# Patient Record
Sex: Male | Born: 1950
Health system: Southern US, Community
[De-identification: ages and names within clinical notes are randomized; demographics above are authoritative.]

## PROBLEM LIST (undated history)

## (undated) DIAGNOSIS — K219 Gastro-esophageal reflux disease without esophagitis: Secondary | ICD-10-CM

## (undated) DIAGNOSIS — Z87442 Personal history of urinary calculi: Secondary | ICD-10-CM

## (undated) DIAGNOSIS — C4491 Basal cell carcinoma of skin, unspecified: Secondary | ICD-10-CM

## (undated) DIAGNOSIS — F32A Depression, unspecified: Secondary | ICD-10-CM

## (undated) DIAGNOSIS — E785 Hyperlipidemia, unspecified: Secondary | ICD-10-CM

## (undated) DIAGNOSIS — F329 Major depressive disorder, single episode, unspecified: Secondary | ICD-10-CM

## (undated) DIAGNOSIS — D229 Melanocytic nevi, unspecified: Secondary | ICD-10-CM

## (undated) DIAGNOSIS — Z8619 Personal history of other infectious and parasitic diseases: Secondary | ICD-10-CM

## (undated) DIAGNOSIS — K439 Ventral hernia without obstruction or gangrene: Secondary | ICD-10-CM

## (undated) DIAGNOSIS — T7840XA Allergy, unspecified, initial encounter: Secondary | ICD-10-CM

## (undated) DIAGNOSIS — K5790 Diverticulosis of intestine, part unspecified, without perforation or abscess without bleeding: Secondary | ICD-10-CM

## (undated) DIAGNOSIS — K922 Gastrointestinal hemorrhage, unspecified: Secondary | ICD-10-CM

## (undated) HISTORY — DX: Basal cell carcinoma of skin, unspecified: C44.91

## (undated) HISTORY — PX: TONSILLECTOMY: SUR1361

## (undated) HISTORY — DX: Hyperlipidemia, unspecified: E78.5

## (undated) HISTORY — DX: Ventral hernia without obstruction or gangrene: K43.9

## (undated) HISTORY — DX: Gastrointestinal hemorrhage, unspecified: K92.2

## (undated) HISTORY — DX: Depression, unspecified: F32.A

## (undated) HISTORY — DX: Allergy, unspecified, initial encounter: T78.40XA

## (undated) HISTORY — DX: Major depressive disorder, single episode, unspecified: F32.9

## (undated) HISTORY — DX: Personal history of other infectious and parasitic diseases: Z86.19

## (undated) HISTORY — DX: Personal history of urinary calculi: Z87.442

## (undated) HISTORY — DX: Diverticulosis of intestine, part unspecified, without perforation or abscess without bleeding: K57.90

## (undated) HISTORY — PX: KIDNEY STONE SURGERY: SHX686

## (undated) HISTORY — DX: Gastro-esophageal reflux disease without esophagitis: K21.9

---
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
## 1989-04-23 DIAGNOSIS — Z87442 Personal history of urinary calculi: Secondary | ICD-10-CM

## 1989-04-23 HISTORY — DX: Personal history of urinary calculi: Z87.442

## 2001-07-24 DIAGNOSIS — K219 Gastro-esophageal reflux disease without esophagitis: Secondary | ICD-10-CM

## 2001-07-24 HISTORY — DX: Gastro-esophageal reflux disease without esophagitis: K21.9

## 2002-02-23 DIAGNOSIS — Z8619 Personal history of other infectious and parasitic diseases: Secondary | ICD-10-CM

## 2002-02-23 HISTORY — DX: Personal history of other infectious and parasitic diseases: Z86.19

## 2002-12-25 DIAGNOSIS — E785 Hyperlipidemia, unspecified: Secondary | ICD-10-CM

## 2002-12-25 HISTORY — DX: Hyperlipidemia, unspecified: E78.5

## 2004-09-23 DIAGNOSIS — K922 Gastrointestinal hemorrhage, unspecified: Secondary | ICD-10-CM

## 2004-09-23 HISTORY — DX: Gastrointestinal hemorrhage, unspecified: K92.2

## 2004-10-24 DIAGNOSIS — K5790 Diverticulosis of intestine, part unspecified, without perforation or abscess without bleeding: Secondary | ICD-10-CM

## 2004-10-24 HISTORY — DX: Diverticulosis of intestine, part unspecified, without perforation or abscess without bleeding: K57.90

## 2007-04-06 ENCOUNTER — Emergency Department (HOSPITAL_COMMUNITY): Admission: EM | Admit: 2007-04-06 | Discharge: 2007-04-06 | Payer: Self-pay | Admitting: Emergency Medicine

## 2008-05-30 ENCOUNTER — Encounter: Admission: RE | Admit: 2008-05-30 | Discharge: 2008-05-30 | Payer: Self-pay | Admitting: Gastroenterology

## 2008-06-21 ENCOUNTER — Encounter: Admission: RE | Admit: 2008-06-21 | Discharge: 2008-06-21 | Payer: Self-pay | Admitting: Family Medicine

## 2008-11-23 DIAGNOSIS — C4491 Basal cell carcinoma of skin, unspecified: Secondary | ICD-10-CM

## 2008-11-23 HISTORY — DX: Basal cell carcinoma of skin, unspecified: C44.91

## 2008-11-28 DIAGNOSIS — D229 Melanocytic nevi, unspecified: Secondary | ICD-10-CM

## 2008-11-28 HISTORY — DX: Melanocytic nevi, unspecified: D22.9

## 2009-12-19 ENCOUNTER — Ambulatory Visit (HOSPITAL_COMMUNITY): Admission: RE | Admit: 2009-12-19 | Discharge: 2009-12-19 | Payer: Self-pay | Admitting: Gastroenterology

## 2010-02-05 ENCOUNTER — Encounter (INDEPENDENT_AMBULATORY_CARE_PROVIDER_SITE_OTHER): Payer: Self-pay | Admitting: Surgery

## 2010-02-05 ENCOUNTER — Inpatient Hospital Stay (HOSPITAL_COMMUNITY)
Admission: RE | Admit: 2010-02-05 | Discharge: 2010-02-09 | Payer: Self-pay | Source: Home / Self Care | Attending: Surgery | Admitting: Surgery

## 2010-02-05 HISTORY — PX: COLECTOMY: SHX59

## 2010-03-16 NOTE — Discharge Summary (Signed)
  Chad Juarez, STANN NO.:  [id number]  MEDICAL RECORD NO.:  [id number]          PATIENT TYPE:  INP  LOCATION:  5126                         FACILITY:  [facility]  PHYSICIAN:  Currie Paris, M.D.DATE OF BIRTH:  1950/05/07  DATE OF ADMISSION:  02/05/2010 DATE OF DISCHARGE:  02/09/2010                              DISCHARGE SUMMARY   FINAL DIAGNOSIS:  Intestinal duplication cyst of sigmoid colon.  CLINICAL HISTORY:  Mr. Nanda is a 60 year old gentleman who was recently found to have a what looked like a large sigmoid diverticulum that at times would become markedly dilated to many centimeters and caused him discomfort.  He elected to proceed to sigmoid colectomy.  HOSPITAL COURSE:  The patient was admitted and taken to the operating room where a sigmoid colectomy was performed.  The operative finding was that of a very large either diverticulum or duplication of the mid- sigmoid.  We simply did a resection of that area of the colon as rest of the colon grossly looked normal, although he had a few scattered tics. I did not think it was appropriate to do a full large sigmoid resection given his symptoms from this large diverticulum.  The patient tolerated the procedure well.  He gradually regained bowel function and was able to started liquids and advanced to solids and by the fourth postoperative day, felt he was able to be discharged.  He was discharged in satisfactory condition to resume usual home meds and given some pain pills.  He is to follow up in my office.  Pathology report indicated that this showed a duplication cyst which was lined while benign colorectal mucosa.     Currie Paris, M.D.     CJS/MEDQ  D:  03/13/2010  T:  03/14/2010  Job:  [id number]  cc:   Griffith Citron, M.D.  Electronically Signed by Cyndia Bent M.D. on 03/16/2010 07:38:24 AM

## 2010-05-06 LAB — DIFFERENTIAL
Basophils Absolute: 0 10*3/uL (ref 0.0–0.1)
Basophils Relative: 0 % (ref 0–1)
Eosinophils Absolute: 0 10*3/uL (ref 0.0–0.7)
Eosinophils Relative: 0 % (ref 0–5)
Lymphocytes Relative: 15 % (ref 12–46)
Lymphs Abs: 1.6 10*3/uL (ref 0.7–4.0)
Monocytes Absolute: 0.7 10*3/uL (ref 0.1–1.0)
Monocytes Relative: 7 % (ref 3–12)
Neutro Abs: 7.9 10*3/uL — ABNORMAL HIGH (ref 1.7–7.7)
Neutrophils Relative %: 77 % (ref 43–77)

## 2010-05-06 LAB — URINALYSIS, ROUTINE W REFLEX MICROSCOPIC
Bilirubin Urine: NEGATIVE
Glucose, UA: NEGATIVE mg/dL
Hgb urine dipstick: NEGATIVE
Ketones, ur: NEGATIVE mg/dL
Nitrite: NEGATIVE
Protein, ur: NEGATIVE mg/dL
Specific Gravity, Urine: 1.002 — ABNORMAL LOW (ref 1.005–1.030)
Urobilinogen, UA: 0.2 mg/dL (ref 0.0–1.0)
pH: 6.5 (ref 5.0–8.0)

## 2010-05-06 LAB — COMPREHENSIVE METABOLIC PANEL
ALT: 12 U/L (ref 0–53)
AST: 19 U/L (ref 0–37)
Albumin: 4.4 g/dL (ref 3.5–5.2)
Alkaline Phosphatase: 68 U/L (ref 39–117)
BUN: 8 mg/dL (ref 6–23)
CO2: 27 mEq/L (ref 19–32)
Calcium: 9.9 mg/dL (ref 8.4–10.5)
Chloride: 105 mEq/L (ref 96–112)
Creatinine, Ser: 0.88 mg/dL (ref 0.4–1.5)
GFR calc Af Amer: >60 mL/min (ref >60)
GFR calc non Af Amer: >60 mL/min (ref >60)
Glucose, Bld: 116 mg/dL — ABNORMAL HIGH (ref 70–99)
Potassium: 4.7 mEq/L (ref 3.5–5.1)
Sodium: 140 mEq/L (ref 135–145)
Total Bilirubin: 0.6 mg/dL (ref 0.3–1.2)
Total Protein: 6.9 g/dL (ref 6.0–8.3)

## 2010-05-06 LAB — PROTIME-INR
INR: 0.94 (ref 0.00–1.49)
Prothrombin Time: 12.8 seconds (ref 11.6–15.2)

## 2010-05-06 LAB — CBC
HCT: 43.4 % (ref 39.0–52.0)
Hemoglobin: 13.8 g/dL (ref 13.0–17.0)
MCH: 27.4 pg (ref 26.0–34.0)
MCHC: 31.8 g/dL (ref 30.0–36.0)
MCV: 86.1 fL (ref 78.0–100.0)
Platelets: 249 10*3/uL (ref 150–400)
RBC: 5.04 MIL/uL (ref 4.22–5.81)
RDW: 12.9 % (ref 11.5–15.5)
WBC: 10.3 10*3/uL (ref 4.0–10.5)

## 2010-05-06 LAB — APTT: aPTT: 27 seconds (ref 24–37)

## 2010-05-06 LAB — SURGICAL PCR SCREEN
MRSA, PCR: NEGATIVE
Staphylococcus aureus: NEGATIVE

## 2010-11-14 LAB — DIFFERENTIAL
Basophils Absolute: 0
Basophils Relative: 0
Eosinophils Absolute: 0
Eosinophils Relative: 0
Lymphocytes Relative: 16
Lymphs Abs: 1.3
Monocytes Absolute: 0.5
Monocytes Relative: 6
Neutro Abs: 6.2
Neutrophils Relative %: 77

## 2010-11-14 LAB — POCT I-STAT CREATININE
Creatinine, Ser: 1
Operator id: 146091

## 2010-11-14 LAB — I-STAT 8, (EC8 V) (CONVERTED LAB)
BUN: 9
Bicarbonate: 28.9 — ABNORMAL HIGH
Chloride: 104
Glucose, Bld: 95
HCT: 44
Hemoglobin: 15
Operator id: 146091
Potassium: 3.8
Sodium: 139
TCO2: 31
pCO2, Ven: 66.8 — ABNORMAL HIGH
pH, Ven: 7.244 — ABNORMAL LOW

## 2010-11-14 LAB — CBC
HCT: 40.7
Hemoglobin: 13.5
MCHC: 33
MCV: 83
Platelets: 264
RBC: 4.91
RDW: 13.5
WBC: 8

## 2010-11-14 LAB — POCT CARDIAC MARKERS
CKMB, poc: <1 — ABNORMAL LOW
CKMB, poc: <1 — ABNORMAL LOW
Myoglobin, poc: 31.2
Myoglobin, poc: 47.7
Operator id: 146091
Operator id: 146091
Troponin i, poc: <0.05
Troponin i, poc: <0.05

## 2011-08-14 ENCOUNTER — Ambulatory Visit (INDEPENDENT_AMBULATORY_CARE_PROVIDER_SITE_OTHER): Payer: BC Managed Care – PPO | Admitting: Family Medicine

## 2011-08-14 VITALS — BP 151/78 | HR 64 | Temp 98.8°F | Resp 16 | Ht 69.25 in | Wt 148.8 lb

## 2011-08-14 DIAGNOSIS — K219 Gastro-esophageal reflux disease without esophagitis: Secondary | ICD-10-CM

## 2011-08-14 DIAGNOSIS — K439 Ventral hernia without obstruction or gangrene: Secondary | ICD-10-CM

## 2011-08-14 LAB — POCT CBC
Granulocyte percent: 77.5 %G (ref 37–80)
HCT, POC: 45.8 % (ref 43.5–53.7)
Hemoglobin: 15.1 g/dL (ref 14.1–18.1)
Lymph, poc: 1.6 (ref 0.6–3.4)
MCH, POC: 29 pg (ref 27–31.2)
MCHC: 33 g/dL (ref 31.8–35.4)
MCV: 88 fL (ref 80–97)
MID (cbc): 0.3 (ref 0–0.9)
MPV: 8.7 fL (ref 0–99.8)
POC Granulocyte: 6.4 (ref 2–6.9)
POC LYMPH PERCENT: 19 %L (ref 10–50)
POC MID %: 3.5 %M (ref 0–12)
Platelet Count, POC: 269 10*3/uL (ref 142–424)
RBC: 5.21 M/uL (ref 4.69–6.13)
RDW, POC: 13.5 %
WBC: 8.3 10*3/uL (ref 4.6–10.2)

## 2011-08-14 LAB — COMPREHENSIVE METABOLIC PANEL
ALT: 19 U/L (ref 0–53)
AST: 19 U/L (ref 0–37)
Albumin: 5.2 g/dL (ref 3.5–5.2)
Alkaline Phosphatase: 68 U/L (ref 39–117)
BUN: 12 mg/dL (ref 6–23)
CO2: 29 mEq/L (ref 19–32)
Calcium: 10.3 mg/dL (ref 8.4–10.5)
Chloride: 103 mEq/L (ref 96–112)
Creat: 0.81 mg/dL (ref 0.50–1.35)
Glucose, Bld: 109 mg/dL — ABNORMAL HIGH (ref 70–99)
Potassium: 5 mEq/L (ref 3.5–5.3)
Sodium: 141 mEq/L (ref 135–145)
Total Bilirubin: 0.7 mg/dL (ref 0.3–1.2)
Total Protein: 7.2 g/dL (ref 6.0–8.3)

## 2011-08-14 MED ORDER — SUCRALFATE 1 G PO TABS: 1.0000 g | ORAL_TABLET | Freq: Four times a day (QID) | ORAL | Status: DC

## 2011-08-14 NOTE — Progress Notes (Signed)
Patient Name: Chad Juarez Date of Birth: 09/19/1950 Medical Record Number: [id number] Gender: male Date of Encounter: 08/14/2011  History of Present Illness:  Chad Juarez is a 61 y.o. very pleasant male patient who presents with the following:  He has noted trouble with GERD for a couple of months.  He ate a very spicy meal in March and developed symptoms, started on prilosec and felt better after a week or so.  He then had return of symptoms in April- started back on prilosec, then changed to prevacid and even tried increasing his dose to 30 BID- it has helped some, but has not resolved his symptoms.  He notes a burning feeling in his esophagus, burping, and more discomfort after spicy or acidic foods and in the afternoon.   He had a partial colonic resection about 18 months ago for giant diverticula/ duplication cyst.  He thinks that he may have a ventral hernia which he has noted for a few months.  It is not painful, but he can see it when he does a sit up.  No SOB, no cough, no weight loss, some nausea, no vomiting.  No bowel habit changes- he is back to normal since his operation.    He has had severe reflux in the past, and it has taken months for it to resolve in the past.  He had a very severe bout in 2004 at which time he was noted to be positive for H. Pylori. He was treated with antibiotics and got better.    He is a retired Teacher, early years/pre.   There is no problem list on file for this patient.  No past medical history on file. No past surgical history on file. History  Substance Use Topics  . Smoking status: Former Smoker    Quit date: 08/13/2001  . Smokeless tobacco: Not on file  . Alcohol Use: Not on file   No family history on file. No Known Allergies  Medication list has been reviewed and updated.  Prior to Admission medications   Medication Sig Start Date End Date Taking? Authorizing Provider  aspirin 81 MG tablet Take 81 mg by mouth daily.   Yes Historical  Provider, MD  lansoprazole (PREVACID) 15 MG capsule Take 30 mg by mouth daily.   Yes Historical Provider, MD  Multiple Vitamin (MULTIVITAMIN) tablet Take 1 tablet by mouth daily.   Yes Historical Provider, MD  rosuvastatin (CRESTOR) 5 MG tablet Take 5 mg by mouth daily.   Yes Historical Provider, MD  vitamin E 400 UNIT capsule Take 400 Units by mouth daily.   Yes Historical Provider, MD    Review of Systems:  As per HPI- otherwise negative.   Physical Examination: Filed Vitals:   08/14/11 1002  BP: 151/78  Pulse: 64  Temp: 98.8 F (37.1 C)  Resp: 16   Filed Vitals:   08/14/11 1002  Height: 5' 9.25" (1.759 m)  Weight: 148 lb 12.8 oz (67.495 kg)   Body mass index is 21.82 kg/(m^2). Ideal Body Weight: Weight in (lb) to have BMI = 25: 170.2   GEN: WDWN, NAD, Non-toxic, A & O x 3 HEENT: Atraumatic, Normocephalic. Neck supple. No masses, No LAD.  Oropharynx wnl, PEERL Ears and Nose: No external deformity. CV: RRR, No M/G/R. No JVD. No thrill. No extra heart sounds. PULM: CTA B, no wheezes, crackles, rhonchi. No retractions. No resp. distress. No accessory muscle use. ABD: S, ND, +BS. No rebound. No HSM.  There is a  small ventral hernia in the upper part of the abdominal wall visible when he does a sit- up.  Minimal tenderness over epigastric area.   EXTR: No c/c/e NEURO Normal gait.  PSYCH: Normally interactive. Conversant. Not depressed or anxious appearing.  Calm demeanor.   Results for orders placed in visit on 08/14/11  POCT CBC      Component Value Range   WBC 8.3  4.6 - 10.2 K/uL   Lymph, poc 1.6  0.6 - 3.4   POC LYMPH PERCENT 19.0  10 - 50 %L   MID (cbc) 0.3  0 - 0.9   POC MID % 3.5  0 - 12 %M   POC Granulocyte 6.4  2 - 6.9   Granulocyte percent 77.5  37 - 80 %G   RBC 5.21  4.69 - 6.13 M/uL   Hemoglobin 15.1  14.1 - 18.1 g/dL   HCT, POC 96.2  95.2 - 53.7 %   MCV 88.0  80 - 97 fL   MCH, POC 29.0  27 - 31.2 pg   MCHC 33.0  31.8 - 35.4 g/dL   RDW, POC 84.1      Platelet Count, POC 269  142 - 424 K/uL   MPV 8.7  0 - 99.8 fL   Assessment and Plan: 1. GERD (gastroesophageal reflux disease)  HELICOBACTER PYLORI  ANTIBODY, IGM, sucralfate (CARAFATE) 1 G tablet, POCT CBC, Comprehensive metabolic panel  2. Ventral hernia     Suspect GERD, ?H. Pylori.  Await H pylori test, add carafate. Continue prilosec. If he does not get better with treatment of H pylori (if positive) or if negative plan likely referral to GI. Will plan further follow- up pending labs. Let us know sooner if worse.  Reassured that ventral hernia likely does not need repair unless it becomes more bothersome.  He does not note any discomfort from it at this time.  He will let me know if symptoms acutely change or get worse.    Abbe Amsterdam, MD

## 2011-08-17 LAB — HELICOBACTER PYLORI  ANTIBODY, IGM: Helicobacter pylori, IgM: 1.7 U/mL (ref <9.0)

## 2011-08-19 ENCOUNTER — Encounter: Payer: Self-pay | Admitting: Gastroenterology

## 2011-08-19 NOTE — Addendum Note (Signed)
Addended by: Abbe Amsterdam C on: 08/19/2011 08:40 AM   Modules accepted: Orders

## 2011-08-25 ENCOUNTER — Encounter: Payer: Self-pay | Admitting: Gastroenterology

## 2011-08-25 ENCOUNTER — Ambulatory Visit (INDEPENDENT_AMBULATORY_CARE_PROVIDER_SITE_OTHER): Payer: Self-pay | Admitting: Gastroenterology

## 2011-08-25 VITALS — BP 110/68 | HR 92 | Ht 70.0 in | Wt 149.1 lb

## 2011-08-25 DIAGNOSIS — K219 Gastro-esophageal reflux disease without esophagitis: Secondary | ICD-10-CM

## 2011-08-25 DIAGNOSIS — Z9049 Acquired absence of other specified parts of digestive tract: Secondary | ICD-10-CM

## 2011-08-25 DIAGNOSIS — Z9889 Other specified postprocedural states: Secondary | ICD-10-CM

## 2011-08-25 MED ORDER — DEXLANSOPRAZOLE 60 MG PO CPDR: 60.0000 mg | DELAYED_RELEASE_CAPSULE | Freq: Every day | ORAL | Status: DC

## 2011-08-25 NOTE — Progress Notes (Signed)
History of Present Illness:  This is a [name] with chronic GERD for many years followed by Dr. Kinnie Juarez. He currently is on Prevacid 30 mg twice a day because of severe recent flares of his burning substernal chest pain and regurgitation. He denies dysphagia or any hepatobiliary complaints, has had previous negative upper nominal ultrasound. He also denies lower gastrointestinal issues since he had partial sigmoid resection because of a large congenital sigmoid colon diverticulum. His surgery was performed by Dr. Cyndia Juarez. He has not had previous endoscopic exams. The patient otherwise is in good health without complaints. He does not smoke, abuse alcohol or NSAIDs. Numerous records and radiographs reviewed at length today. Also colonoscopy from Dr. Kinnie Juarez requested for review.  I have reviewed this patient's present history, medical and surgical past history, allergies and medications.     ROS: The remainder of the 10 point ROS is negative... no history of Raynaud's phenomenon.     Physical Exam: Blood pressure 110 or 68, pulse 92 and regular, weight 149 pounds. General well developed well nourished patient in no acute distress, appearing their stated age Eyes PERRLA, no icterus, fundoscopic exam per opthamologist Skin no lesions noted Neck supple, no adenopathy, no thyroid enlargement, no tenderness Chest clear to percussion and auscultation Heart no significant murmurs, gallops or rubs noted Abdomen no hepatosplenomegaly masses or tenderness, BS normal. Ventral hernia demonstrated with straight leg testing. Extremities no acute joint lesions, edema, phlebitis or evidence of cellulitis. Neurologic patient oriented x 3, cranial nerves intact, no focal neurologic deficits noted. Psychological mental status normal and normal affect.  Assessment and plan: Chronic GERD and probable prolapsing hiatal hernia. Endoscopic exam is been scheduled to exclude Barrett's mucosa,  rule out H. pylori, and to assess his esophageal healing on twice a day PPI therapy. I discussed an anti-reflux regime with him and the need for chronic PPI therapy. Also patient education video on GERD shown to patient. He is up-to-date on his colonoscopy exams. I have changed him to Dexilant 60 mg every morning as tolerated. He'll continue primary care followup as scheduled. Please copy this note to Dr. Ellamae Juarez.  Encounter Diagnosis  Name Primary?  . GERD (gastroesophageal reflux disease) Yes

## 2011-08-25 NOTE — Patient Instructions (Addendum)
Upper GI Endoscopy Upper GI endoscopy means using a flexible scope to look at the esophagus, stomach, and upper small bowel. This is done to make a diagnosis in people with heartburn, abdominal pain, or abnormal bleeding. Sometimes an endoscope is needed to remove foreign bodies or food that become stuck in the esophagus; it can also be used to take biopsy samples. For the best results, do not eat or drink for 8 hours before having your upper endoscopy.  To perform the endoscopy, you will probably be sedated and your throat will be numbed with a special spray. The endoscope is then slowly passed down your throat (this will not interfere with your breathing). An endoscopy exam takes 15 to 30 minutes to complete and there is no real pain. Patients rarely remember much about the procedure. The results of the test may take several days if a biopsy or other test is taken.  You may have a sore throat after an endoscopy exam. Serious complications are very rare. Stick to liquids and soft foods until your pain is better. Do not drive a car or operate any dangerous equipment for at least 24 hours after being sedated. SEEK IMMEDIATE MEDICAL CARE IF:   You have severe throat pain.   You have shortness of breath.   You have bleeding problems.   You have a fever.   You have difficulty recovering from your sedation.  Document Released: 03/19/2004 Document Revised: 01/29/2011 Document Reviewed: 02/12/2008 [facility] Patient Information 2012 [place], [place].  Your Endoscopy is scheduled on 09/04/2011 at 2:30pm on the 4th floor Separate instructions have been given

## 2011-09-04 ENCOUNTER — Ambulatory Visit (AMBULATORY_SURGERY_CENTER): Payer: BC Managed Care – PPO | Admitting: Gastroenterology

## 2011-09-04 ENCOUNTER — Encounter: Payer: Self-pay | Admitting: Gastroenterology

## 2011-09-04 VITALS — BP 140/91 | HR 83 | Temp 95.7°F | Resp 12 | Ht 70.0 in | Wt 149.0 lb

## 2011-09-04 DIAGNOSIS — K219 Gastro-esophageal reflux disease without esophagitis: Secondary | ICD-10-CM

## 2011-09-04 MED ORDER — SODIUM CHLORIDE 0.9 % IV SOLN: 500.0000 mL | INTRAVENOUS | Status: DC

## 2011-09-04 NOTE — Progress Notes (Signed)
Patient did not experience any of the following events: a burn prior to discharge; a fall within the facility; wrong site/side/patient/procedure/implant event; or a hospital transfer or hospital admission upon discharge from the facility. (G8907) Patient did not have preoperative order for IV antibiotic SSI prophylaxis. (G8918)  

## 2011-09-04 NOTE — Op Note (Signed)
[facility] [address] [place], [place]  [address]  ENDOSCOPY PROCEDURE REPORT  PATIENT:  Chad, Juarez  MR#:  [id number] BIRTHDATE:  [birth year], 60 yrs. old  GENDER:  male  ENDOSCOPIST:  Vania Rea. Jarold Motto, MD, [facility] Referred by:  PROCEDURE DATE:  09/04/2011 PROCEDURE:  EGD with biopsy for H. pylori 19147 ASA CLASS:  Class II INDICATIONS:  heartburn R/O BARRETT'S MUCOSA  MEDICATIONS:   propofol (Diprivan) 150 mg IV TOPICAL ANESTHETIC:  Cetacaine Spray  DESCRIPTION OF PROCEDURE:   After the risks and benefits of the procedure were explained, informed consent was obtained.  The [facility] GIF-[id number] [id number] endoscope was introduced through the mouth and advanced to the second portion of the duodenum.  The instrument was slowly withdrawn as the mucosa was fully examined. <<PROCEDUREIMAGES>>  The upper, middle, and distal third of the esophagus were carefully inspected and no abnormalities were noted. The z-line was well seen at the GEJ. The endoscope was pushed into the fundus which was normal including a retroflexed view. The antrum,gastric body, first and second part of the duodenum were unremarkable. CLO BX. DONE.    NO LARGE HH NOTED.  The scope was then withdrawn from the patient and the procedure completed.  COMPLICATIONS:  None  ENDOSCOPIC IMPRESSION: 1) Normal EGD TREATED GERD. RECOMMENDATIONS: 1) Await biopsy results 2) Rx CLO if positive 3) continue PPI  ______________________________ Vania Rea. Jarold Motto, MD, [facility]  CC:  Ellamae Sia, MD  n. Rosalie Doctor:   Vania Rea. Torsha Lemus at 09/04/2011 02:32 PM  Job Founds, [id number]

## 2011-09-04 NOTE — Progress Notes (Signed)
Propofol per crna j.nulty. See scanned intra procedure report. ewm

## 2011-09-04 NOTE — Patient Instructions (Addendum)
Discharge instructions given with verbal understanding. Resume previous medications. YOU HAD AN ENDOSCOPIC PROCEDURE TODAY AT THE [facility]: Refer to the procedure report that was given to you for any specific questions about what was found during the examination.  If the procedure report does not answer your questions, please call your gastroenterologist to clarify.  If you requested that your care partner not be given the details of your procedure findings, then the procedure report has been included in a sealed envelope for you to review at your convenience later.  YOU SHOULD EXPECT: Some feelings of bloating in the abdomen. Passage of more gas than usual.  Walking can help get rid of the air that was put into your GI tract during the procedure and reduce the bloating. If you had a lower endoscopy (such as a colonoscopy or flexible sigmoidoscopy) you may notice spotting of blood in your stool or on the toilet paper. If you underwent a bowel prep for your procedure, then you may not have a normal bowel movement for a few days.  DIET: Your first meal following the procedure should be a light meal and then it is ok to progress to your normal diet.  A half-sandwich or bowl of soup is an example of a good first meal.  Heavy or fried foods are harder to digest and may make you feel nauseous or bloated.  Likewise meals heavy in dairy and vegetables can cause extra gas to form and this can also increase the bloating.  Drink plenty of fluids but you should avoid alcoholic beverages for 24 hours.  ACTIVITY: Your care partner should take you home directly after the procedure.  You should plan to take it easy, moving slowly for the rest of the day.  You can resume normal activity the day after the procedure however you should NOT DRIVE or use heavy machinery for 24 hours (because of the sedation medicines used during the test).    SYMPTOMS TO REPORT IMMEDIATELY: A gastroenterologist can be reached  at any hour.  During normal business hours, 8:30 AM to 5:00 PM Monday through Friday, call [phone].  After hours and on weekends, please call the GI answering service at [phone] who will take a message and have the physician on call contact you.   Following upper endoscopy (EGD)  Vomiting of blood or coffee ground material  New chest pain or pain under the shoulder blades  Painful or persistently difficult swallowing  New shortness of breath  Fever of 100F or higher  Black, tarry-looking stools  FOLLOW UP: If any biopsies were taken you will be contacted by phone or by letter within the next 1-3 weeks.  Call your gastroenterologist if you have not heard about the biopsies in 3 weeks.  Our staff will call the home number listed on your records the next business day following your procedure to check on you and address any questions or concerns that you may have at that time regarding the information given to you following your procedure. This is a courtesy call and so if there is no answer at the home number and we have not heard from you through the emergency physician on call, we will assume that you have returned to your regular daily activities without incident.  SIGNATURES/CONFIDENTIALITY: You and/or your care partner have signed paperwork which will be entered into your electronic medical record.  These signatures attest to the fact that that the information above on your After Visit Summary   has been reviewed and is understood.  Full responsibility of the confidentiality of this discharge information lies with you and/or your care-partner. 

## 2011-09-07 ENCOUNTER — Telehealth: Payer: Self-pay | Admitting: *Deleted

## 2011-09-07 NOTE — Telephone Encounter (Signed)
  Follow up Call-  Call back number 09/04/2011  Post procedure Call Back phone  # [phone]  Permission to leave phone message Yes     Patient questions:  Do you have a fever, pain , or abdominal swelling? no Pain Score  0 *  Have you tolerated food without any problems? yes  Have you been able to return to your normal activities? yes  Do you have any questions about your discharge instructions: Diet   no Medications  no Follow up visit  no  Do you have questions or concerns about your Care? no  Actions: * If pain score is 4 or above: No action needed, pain <4.

## 2011-09-28 ENCOUNTER — Telehealth: Payer: Self-pay | Admitting: Gastroenterology

## 2011-09-28 NOTE — Telephone Encounter (Signed)
Notified pt his bx for H. Pylori was negative; pt stated understanding.

## 2011-12-22 ENCOUNTER — Ambulatory Visit (INDEPENDENT_AMBULATORY_CARE_PROVIDER_SITE_OTHER): Payer: BC Managed Care – PPO | Admitting: Family Medicine

## 2011-12-22 DIAGNOSIS — Z23 Encounter for immunization: Secondary | ICD-10-CM

## 2012-06-29 ENCOUNTER — Ambulatory Visit (INDEPENDENT_AMBULATORY_CARE_PROVIDER_SITE_OTHER): Payer: BC Managed Care – PPO | Admitting: Internal Medicine

## 2012-06-29 ENCOUNTER — Encounter: Payer: Self-pay | Admitting: Internal Medicine

## 2012-06-29 VITALS — BP 148/81 | HR 77 | Temp 97.0°F | Resp 17 | Wt 147.0 lb

## 2012-06-29 DIAGNOSIS — Z0000 Encounter for general adult medical examination without abnormal findings: Secondary | ICD-10-CM

## 2012-06-29 DIAGNOSIS — E785 Hyperlipidemia, unspecified: Secondary | ICD-10-CM

## 2012-06-29 LAB — CBC WITH DIFFERENTIAL/PLATELET
Basophils Absolute: 0 10*3/uL (ref 0.0–0.1)
Basophils Relative: 0 % (ref 0–1)
Eosinophils Absolute: 0 10*3/uL (ref 0.0–0.7)
Lymphs Abs: 1.6 10*3/uL (ref 0.7–4.0)
MCH: 29.1 pg (ref 26.0–34.0)
MCHC: 33.8 g/dL (ref 30.0–36.0)
Neutrophils Relative %: 78 % — ABNORMAL HIGH (ref 43–77)
Platelets: 229 10*3/uL (ref 150–400)
RBC: 5.56 MIL/uL (ref 4.22–5.81)
RDW: 12.7 % (ref 11.5–15.5)

## 2012-06-29 LAB — POCT URINALYSIS DIPSTICK
Leukocytes, UA: NEGATIVE
Protein, UA: NEGATIVE
Urobilinogen, UA: 0.2
pH, UA: 5.5

## 2012-06-29 LAB — IFOBT (OCCULT BLOOD): IFOBT: NEGATIVE

## 2012-06-29 MED ORDER — FLUTICASONE PROPIONATE 50 MCG/ACT NA SUSP: 2.0000 | Freq: Every day | NASAL | Status: DC

## 2012-06-29 MED ORDER — ROSUVASTATIN CALCIUM 5 MG PO TABS: 5.0000 mg | ORAL_TABLET | Freq: Every day | ORAL | Status: DC

## 2012-06-29 NOTE — Progress Notes (Signed)
  Subjective:    Patient ID: Chad Juarez, male    DOB: 17-Jul-1950, 62 y.o.   MRN: [id number]  HPIcpe  hyperlipidemia  retir Chad Juarez Doing well x mom alz(a fib,falls,pulm htn)/father insists on caretaking so Chad Juarez becomes 24h 7d/wk medical on call as 2 sibs dont provde help  Colon utd-dr Chad Juarez Recent GERD resolved neph hodgkins stable ? Cured  immun utd-consider zosta  Review of Systems Neg x aller rhin And sleep disturb from above stress   and pain L hip w/ activity req a few days of rest And occas HAs in same place after trauma  Objective:   Physical Exam BP 148/81  Pulse 77  Temp(Src) 97 F (36.1 C) (Oral)  Resp 17  Wt 147 lb (66.679 kg)  BMI 21.09 kg/m2 HEENT-cl x boggy turbs No tmeg Ht reg w/out m,c,r,g Lungs clear abd supple Pros WNL ? L 5 oclock Heme extr clear Neuro intact Psych stable      Assessment & Plan:  Annual CPE Hyperlip Hip Pain All Rhin  To d/c vit D and ASA Hip exercises given/pt next or ortho Advised vacation respite from care of parents  meds after labs

## 2012-06-30 LAB — COMPREHENSIVE METABOLIC PANEL
ALT: 19 U/L (ref 0–53)
AST: 19 U/L (ref 0–37)
Alkaline Phosphatase: 79 U/L (ref 39–117)
Creat: 0.85 mg/dL (ref 0.50–1.35)
Sodium: 136 mEq/L (ref 135–145)
Total Bilirubin: 0.8 mg/dL (ref 0.3–1.2)
Total Protein: 8 g/dL (ref 6.0–8.3)

## 2012-06-30 LAB — LIPID PANEL
LDL Cholesterol: 93 mg/dL (ref 0–99)
Total CHOL/HDL Ratio: 2.7 Ratio
VLDL: 15 mg/dL (ref 0–40)

## 2012-07-04 ENCOUNTER — Encounter: Payer: Self-pay | Admitting: Internal Medicine

## 2012-12-19 ENCOUNTER — Ambulatory Visit (INDEPENDENT_AMBULATORY_CARE_PROVIDER_SITE_OTHER): Payer: BC Managed Care – PPO

## 2012-12-19 DIAGNOSIS — Z23 Encounter for immunization: Secondary | ICD-10-CM

## 2013-07-26 ENCOUNTER — Ambulatory Visit (INDEPENDENT_AMBULATORY_CARE_PROVIDER_SITE_OTHER): Payer: BC Managed Care – PPO | Admitting: Internal Medicine

## 2013-07-26 ENCOUNTER — Encounter: Payer: Self-pay | Admitting: Internal Medicine

## 2013-07-26 VITALS — BP 126/60 | HR 81 | Temp 98.4°F | Resp 16 | Ht 69.5 in | Wt 143.8 lb

## 2013-07-26 DIAGNOSIS — Z125 Encounter for screening for malignant neoplasm of prostate: Secondary | ICD-10-CM

## 2013-07-26 DIAGNOSIS — Z0000 Encounter for general adult medical examination without abnormal findings: Secondary | ICD-10-CM

## 2013-07-26 DIAGNOSIS — E785 Hyperlipidemia, unspecified: Secondary | ICD-10-CM

## 2013-07-26 DIAGNOSIS — Z1211 Encounter for screening for malignant neoplasm of colon: Secondary | ICD-10-CM

## 2013-07-26 LAB — CBC WITH DIFFERENTIAL/PLATELET
BASOS ABS: 0 10*3/uL (ref 0.0–0.1)
Basophils Relative: 0 % (ref 0–1)
EOS PCT: 0 % (ref 0–5)
Eosinophils Absolute: 0 10*3/uL (ref 0.0–0.7)
HEMATOCRIT: 45.2 % (ref 39.0–52.0)
Hemoglobin: 15.3 g/dL (ref 13.0–17.0)
Lymphocytes Relative: 19 % (ref 12–46)
Lymphs Abs: 1.6 10*3/uL (ref 0.7–4.0)
MCH: 29.4 pg (ref 26.0–34.0)
MCHC: 33.8 g/dL (ref 30.0–36.0)
MCV: 86.8 fL (ref 78.0–100.0)
MONO ABS: 0.5 10*3/uL (ref 0.1–1.0)
MONOS PCT: 6 % (ref 3–12)
NEUTROS ABS: 6.2 10*3/uL (ref 1.7–7.7)
Neutrophils Relative %: 75 % (ref 43–77)
Platelets: 215 10*3/uL (ref 150–400)
RBC: 5.21 MIL/uL (ref 4.22–5.81)
RDW: 13.2 % (ref 11.5–15.5)
WBC: 8.2 10*3/uL (ref 4.0–10.5)

## 2013-07-26 LAB — POCT URINALYSIS DIPSTICK
BILIRUBIN UA: NEGATIVE
Blood, UA: NEGATIVE
Glucose, UA: NEGATIVE
KETONES UA: NEGATIVE
Leukocytes, UA: NEGATIVE
Nitrite, UA: NEGATIVE
Protein, UA: NEGATIVE
Urobilinogen, UA: 0.2
pH, UA: 6

## 2013-07-26 LAB — IFOBT (OCCULT BLOOD): IMMUNOLOGICAL FECAL OCCULT BLOOD TEST: NEGATIVE

## 2013-07-26 MED ORDER — FLUTICASONE PROPIONATE 50 MCG/ACT NA SUSP: 2.0000 | Freq: Every day | NASAL | Status: DC

## 2013-07-26 MED ORDER — ROSUVASTATIN CALCIUM 5 MG PO TABS: 5.0000 mg | ORAL_TABLET | Freq: Every day | ORAL | Status: DC

## 2013-07-26 NOTE — Progress Notes (Signed)
Subjective:    Patient ID: Chad Juarez, male    DOB: June 13, 1950, 63 y.o.   MRN: [id number]  HPIannual  only medical problem is hyperlipidemia Continues to spend a lot of time caring for elderly parents, particularly his mother now almost wheelchair-bound Retired Software engineer  Paper chart reveals immunizations up-to-date in colonoscopy not due for the second screening for another 5 years  Past history, family history and social history are unchanged   Review of Systems  Constitutional: Negative.   HENT: Negative.   Eyes: Negative.   Respiratory: Negative.   Cardiovascular: Negative.   Gastrointestinal: Negative.   Endocrine: Negative.   Genitourinary: Negative.   Musculoskeletal: Negative.   Skin: Negative.   Allergic/Immunologic: Positive for environmental allergies.  Neurological: Negative.   Hematological: Negative.   Psychiatric/Behavioral: Positive for sleep disturbance.   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 He appears well-developed and well-nourished.  HENT:  Head: Normocephalic.  Right Ear: External ear normal.  Left Ear: External ear normal.  Nose: Nose normal.  Mouth/Throat: Oropharynx is clear and moist.  Tms and canals clear  Eyes: Conjunctivae and EOM are normal. Pupils are equal, round, and reactive to light.  Neck: Normal range of motion. Neck supple. No thyromegaly present.  Cardiovascular: Normal rate, regular rhythm, normal heart sounds and intact distal pulses.   No murmur heard. Pulmonary/Chest: Effort normal and breath sounds normal. No respiratory distress. He has no wheezes. He has no rales.  Abdominal: Soft. Bowel sounds are normal. He exhibits no distension and no mass. There is no tenderness. There is no rebound and no guarding.  No hepatosplenomegaly  Genitourinary:  Rectal exam shows no masses the prostate is soft and  symmetrical without nodules//Hemosure obtained  Musculoskeletal: Normal range of motion. He exhibits no edema and no tenderness.  Lymphadenopathy:    He has no cervical adenopathy.  Neurological: He is alert and oriented to person, place, and time. He has normal reflexes. No cranial nerve deficit. He exhibits normal muscle tone. Coordination normal.  Skin: Skin is warm and dry. No rash noted.  Psychiatric: He has a normal mood and affect. His behavior is normal. Judgment and thought content normal.   BP 126/60  Pulse 81  Temp(Src) 98.4 F (36.9 C) (Oral)  Resp 16  Ht 5' 9.5" (1.765 m)  Wt 143 lb 12.8 oz (65.227 kg)  BMI 20.94 kg/m2  SpO2 98%        Assessment & Plan:  Annual exam Problem #1 hyperlipidemia  Results for orders placed in visit on 07/26/13  CBC WITH DIFFERENTIAL      Result Value Ref Range   WBC 8.2  4.0 - 10.5 K/uL   RBC 5.21  4.22 - 5.81 MIL/uL   Hemoglobin 15.3  13.0 - 17.0 g/dL   HCT 45.2  39.0 - 52.0 %   MCV 86.8  78.0 - 100.0 fL   MCH 29.4  26.0 - 34.0 pg   MCHC 33.8  30.0 - 36.0 g/dL   RDW 13.2  11.5 - 15.5 %   Platelets 215  150 - 400 K/uL   Neutrophils Relative % 75  43 - 77 %   Neutro Abs 6.2  1.7 - 7.7 K/uL   Lymphocytes Relative 19  12 - 46 %   Lymphs Abs 1.6  0.7 - 4.0 K/uL   Monocytes Relative  6  3 - 12 %   Monocytes Absolute 0.5  0.1 - 1.0 K/uL   Eosinophils Relative 0  0 - 5 %   Eosinophils Absolute 0.0  0.0 - 0.7 K/uL   Basophils Relative 0  0 - 1 %   Basophils Absolute 0.0  0.0 - 0.1 K/uL   Smear Review Criteria for review not met    COMPREHENSIVE METABOLIC PANEL      Result Value Ref Range   Sodium 137  135 - 145 mEq/L   Potassium 4.0  3.5 - 5.3 mEq/L   Chloride 97  96 - 112 mEq/L   CO2 29  19 - 32 mEq/L   Glucose, Bld 92  70 - 99 mg/dL   BUN 10  6 - 23 mg/dL   Creat 0.81  0.50 - 1.35 mg/dL   Total Bilirubin 0.8  0.2 - 1.2 mg/dL   Alkaline Phosphatase 70  39 - 117 U/L   AST 21  0 - 37 U/L   ALT 20  0 - 53 U/L   Total  Protein 7.2  6.0 - 8.3 g/dL   Albumin 5.0  3.5 - 5.2 g/dL   Calcium 10.0  8.4 - 10.5 mg/dL  LIPID PANEL      Result Value Ref Range   Cholesterol 157  0 - 200 mg/dL   Triglycerides 82  <150 mg/dL   HDL 60  >39 mg/dL   Total CHOL/HDL Ratio 2.6     VLDL 16  0 - 40 mg/dL   LDL Cholesterol 81  0 - 99 mg/dL  PSA      Result Value Ref Range   PSA 2.13  <=4.00 ng/mL  POCT URINALYSIS DIPSTICK      Result Value Ref Range   Color, UA yellow     Clarity, UA clear     Glucose, UA neg     Bilirubin, UA neg     Ketones, UA neg     Spec Grav, UA <=1.005     Blood, UA neg     pH, UA 6.0     Protein, UA neg     Urobilinogen, UA 0.2     Nitrite, UA neg     Leukocytes, UA Negative    IFOBT (OCCULT BLOOD)      Result Value Ref Range   IFOBT Negative     Meds ordered this encounter  Medications  . OVER THE COUNTER MEDICATION    Sig: Famotidine (Pepcid) 20 mg prn  . fluticasone (FLONASE) 50 MCG/ACT nasal spray    Sig: Place 2 sprays into both nostrils daily.    Dispense:  16 g    Refill:  6  . rosuvastatin (CRESTOR) 5 MG tablet    Sig: Take 1 tablet (5 mg total) by mouth daily.    Dispense:  90 tablet    Refill:  3

## 2013-07-27 LAB — COMPREHENSIVE METABOLIC PANEL
ALBUMIN: 5 g/dL (ref 3.5–5.2)
ALT: 20 U/L (ref 0–53)
AST: 21 U/L (ref 0–37)
Alkaline Phosphatase: 70 U/L (ref 39–117)
BUN: 10 mg/dL (ref 6–23)
CO2: 29 meq/L (ref 19–32)
Calcium: 10 mg/dL (ref 8.4–10.5)
Chloride: 97 mEq/L (ref 96–112)
Creat: 0.81 mg/dL (ref 0.50–1.35)
Glucose, Bld: 92 mg/dL (ref 70–99)
POTASSIUM: 4 meq/L (ref 3.5–5.3)
Sodium: 137 mEq/L (ref 135–145)
Total Bilirubin: 0.8 mg/dL (ref 0.2–1.2)
Total Protein: 7.2 g/dL (ref 6.0–8.3)

## 2013-07-27 LAB — LIPID PANEL
CHOLESTEROL: 157 mg/dL (ref 0–200)
HDL: 60 mg/dL (ref >39)
LDL Cholesterol: 81 mg/dL (ref 0–99)
Total CHOL/HDL Ratio: 2.6 Ratio
Triglycerides: 82 mg/dL (ref <150)
VLDL: 16 mg/dL (ref 0–40)

## 2013-07-27 LAB — PSA: PSA: 2.13 ng/mL (ref <=4.00)

## 2013-08-04 ENCOUNTER — Encounter: Payer: Self-pay | Admitting: Internal Medicine

## 2013-08-10 ENCOUNTER — Encounter: Payer: Self-pay | Admitting: Family Medicine

## 2013-12-21 ENCOUNTER — Ambulatory Visit (INDEPENDENT_AMBULATORY_CARE_PROVIDER_SITE_OTHER): Payer: BC Managed Care – PPO

## 2013-12-21 DIAGNOSIS — Z23 Encounter for immunization: Secondary | ICD-10-CM

## 2014-05-23 ENCOUNTER — Other Ambulatory Visit: Payer: Self-pay | Admitting: Dermatology

## 2014-05-23 DIAGNOSIS — C4491 Basal cell carcinoma of skin, unspecified: Secondary | ICD-10-CM

## 2014-05-23 HISTORY — DX: Basal cell carcinoma of skin, unspecified: C44.91

## 2014-08-01 ENCOUNTER — Ambulatory Visit (INDEPENDENT_AMBULATORY_CARE_PROVIDER_SITE_OTHER): Payer: BC Managed Care – PPO | Admitting: Internal Medicine

## 2014-08-01 ENCOUNTER — Encounter: Payer: Self-pay | Admitting: Internal Medicine

## 2014-08-01 ENCOUNTER — Other Ambulatory Visit: Payer: Self-pay | Admitting: Internal Medicine

## 2014-08-01 VITALS — BP 125/68 | HR 70 | Temp 98.4°F | Resp 16 | Ht 69.5 in | Wt 142.0 lb

## 2014-08-01 DIAGNOSIS — Z0000 Encounter for general adult medical examination without abnormal findings: Secondary | ICD-10-CM

## 2014-08-01 DIAGNOSIS — E785 Hyperlipidemia, unspecified: Secondary | ICD-10-CM | POA: Diagnosis not present

## 2014-08-01 LAB — POCT URINALYSIS DIPSTICK
BILIRUBIN UA: NEGATIVE
Blood, UA: NEGATIVE
Glucose, UA: NEGATIVE
Ketones, UA: NEGATIVE
LEUKOCYTES UA: NEGATIVE
Nitrite, UA: NEGATIVE
Protein, UA: NEGATIVE
Urobilinogen, UA: 0.2
pH, UA: 5.5

## 2014-08-01 MED ORDER — FLUTICASONE PROPIONATE 50 MCG/ACT NA SUSP: 2.0000 | Freq: Every day | NASAL | Status: DC

## 2014-08-01 MED ORDER — ROSUVASTATIN CALCIUM 5 MG PO TABS: 5.0000 mg | ORAL_TABLET | Freq: Every day | ORAL | Status: DC

## 2014-08-01 NOTE — Progress Notes (Signed)
Subjective:    Patient ID: Chad Juarez, male    DOB: Jul 14, 1950, 64 y.o.   MRN: [id number]  HPI Annual exam Patient Active Problem List   Diagnosis Date Noted  . Other and unspecified hyperlipidemia 06/29/2012    -  [place] Doing well in general Still extremely busy postretirement caring for his parents//his siblings offer little help//he also has 2 aging dogs that may be near their end  Health maintenance issues up-to-date  Review of Systems  Constitutional: Negative.   HENT: Negative.   Eyes: Negative.   Respiratory: Negative.   Cardiovascular: Negative.   Gastrointestinal: Negative.   Endocrine: Negative.   Genitourinary: Negative.   Musculoskeletal: Positive for arthralgias.  Skin: Negative.   Allergic/Immunologic: Positive for environmental allergies.  Neurological: Negative.   Hematological: Negative.   Psychiatric/Behavioral: Positive for sleep disturbance.   sleep mainly affected by racing mind/no daytime hypersomnolence     Objective:   Physical Exam  Constitutional: He is oriented to person, place, and time. He appears well-developed and well-nourished.  HENT:  Head: Normocephalic and atraumatic.  Right Ear: Hearing, tympanic membrane, external ear and ear canal normal.  Left Ear: Hearing, tympanic membrane, external ear and ear canal normal.  Nose: Nose normal.  Mouth/Throat: Uvula is midline, oropharynx is clear and moist and mucous membranes are normal.  Eyes: Conjunctivae, EOM and lids are normal. Pupils are equal, round, and reactive to light. Right eye exhibits no discharge. Left eye exhibits no discharge. No scleral icterus.  Neck: Trachea normal and normal range of motion. Neck supple. Carotid bruit is not present.  Cardiovascular: Normal rate, regular rhythm, normal heart sounds, intact distal pulses and normal pulses.   No murmur heard. Pulmonary/Chest: Effort normal and breath sounds normal. No respiratory distress. He has no wheezes. He has no  rhonchi. He has no rales.  Abdominal: Soft. Normal appearance and bowel sounds are normal. He exhibits no abdominal bruit. There is no tenderness.  Musculoskeletal: Normal range of motion. He exhibits no edema or tenderness.  Lymphadenopathy:       Head (right side): No submental, no submandibular, no tonsillar, no preauricular, no posterior auricular and no occipital adenopathy present.       Head (left side): No submental, no submandibular, no tonsillar, no preauricular, no posterior auricular and no occipital adenopathy present.    He has no cervical adenopathy.  Neurological: He is alert and oriented to person, place, and time. He has normal strength and normal reflexes. No cranial nerve deficit or sensory deficit. Coordination and gait normal.  Skin: Skin is warm, dry and intact. No lesion and no rash noted.  Psychiatric: He has a normal mood and affect. His speech is normal and behavior is normal. Judgment and thought content normal.  BP 125/68 mmHg  Pulse 70  Temp(Src) 98.4 F (36.9 C) (Oral)  Resp 16  Ht 5' 9.5" (1.765 m)  Wt 142 lb (64.411 kg)  BMI 20.68 kg/m2  SpO2 96%    Assessment & Plan:  Hyperlipidemia - Plan: CBC with Differential/Platelet, Comprehensive metabolic panel, Lipid panel, PSA  Routine general medical examination at a health care facility - Plan: POCT urinalysis dipstick  Meds ordered this encounter  Medications  . rosuvastatin (CRESTOR) 5 MG tablet    Sig: Take 1 tablet (5 mg total) by mouth daily.    Dispense:  90 tablet    Refill:  3  . fluticasone (FLONASE) 50 MCG/ACT nasal spray    Sig: Place 2 sprays into  both nostrils daily.    Dispense:  16 g    Refill:  6

## 2014-08-02 ENCOUNTER — Encounter: Payer: Self-pay | Admitting: Internal Medicine

## 2014-08-02 LAB — CBC WITH DIFFERENTIAL/PLATELET
BASOS PCT: 0 % (ref 0–1)
Basophils Absolute: 0 10*3/uL (ref 0.0–0.1)
Eosinophils Absolute: 0 10*3/uL (ref 0.0–0.7)
Eosinophils Relative: 0 % (ref 0–5)
HCT: 43.5 % (ref 39.0–52.0)
Hemoglobin: 14.4 g/dL (ref 13.0–17.0)
Lymphocytes Relative: 27 % (ref 12–46)
Lymphs Abs: 1.8 10*3/uL (ref 0.7–4.0)
MCH: 29.1 pg (ref 26.0–34.0)
MCHC: 33.1 g/dL (ref 30.0–36.0)
MCV: 87.9 fL (ref 78.0–100.0)
MPV: 9.7 fL (ref 8.6–12.4)
Monocytes Absolute: 0.5 10*3/uL (ref 0.1–1.0)
Monocytes Relative: 7 % (ref 3–12)
Neutro Abs: 4.4 10*3/uL (ref 1.7–7.7)
Neutrophils Relative %: 66 % (ref 43–77)
Platelets: 210 10*3/uL (ref 150–400)
RBC: 4.95 MIL/uL (ref 4.22–5.81)
RDW: 13.2 % (ref 11.5–15.5)
WBC: 6.7 10*3/uL (ref 4.0–10.5)

## 2014-08-02 LAB — COMPREHENSIVE METABOLIC PANEL
ALBUMIN: 4.8 g/dL (ref 3.5–5.2)
ALT: 32 U/L (ref 0–53)
AST: 24 U/L (ref 0–37)
Alkaline Phosphatase: 55 U/L (ref 39–117)
BUN: 12 mg/dL (ref 6–23)
CO2: 25 mEq/L (ref 19–32)
Calcium: 9.9 mg/dL (ref 8.4–10.5)
Chloride: 103 mEq/L (ref 96–112)
Creat: 0.73 mg/dL (ref 0.50–1.35)
Glucose, Bld: 97 mg/dL (ref 70–99)
Potassium: 4.8 mEq/L (ref 3.5–5.3)
Sodium: 141 mEq/L (ref 135–145)
Total Bilirubin: 0.7 mg/dL (ref 0.2–1.2)
Total Protein: 6.8 g/dL (ref 6.0–8.3)

## 2014-08-02 LAB — LIPID PANEL
Cholesterol: 153 mg/dL (ref 0–200)
HDL: 67 mg/dL (ref >=40)
LDL Cholesterol: 75 mg/dL (ref 0–99)
TRIGLYCERIDES: 55 mg/dL (ref <150)
Total CHOL/HDL Ratio: 2.3 Ratio
VLDL: 11 mg/dL (ref 0–40)

## 2014-08-02 LAB — PSA: PSA: 3.63 ng/mL (ref <=4.00)

## 2014-08-04 LAB — CP2131 PSA, TOTAL AND FREE
PSA, Free Pct: 12 % — ABNORMAL LOW (ref >25)
PSA, Free: 0.44 ng/mL
PSA: 3.63 ng/mL (ref <=4.00)

## 2014-08-05 ENCOUNTER — Encounter: Payer: Self-pay | Admitting: Internal Medicine

## 2014-08-06 ENCOUNTER — Encounter: Payer: Self-pay | Admitting: Internal Medicine

## 2014-12-11 ENCOUNTER — Encounter: Payer: Self-pay | Admitting: Gastroenterology

## 2014-12-14 ENCOUNTER — Ambulatory Visit (INDEPENDENT_AMBULATORY_CARE_PROVIDER_SITE_OTHER): Payer: BC Managed Care – PPO

## 2014-12-14 DIAGNOSIS — Z23 Encounter for immunization: Secondary | ICD-10-CM | POA: Diagnosis not present

## 2015-01-21 ENCOUNTER — Encounter: Payer: Self-pay | Admitting: Internal Medicine

## 2015-08-01 ENCOUNTER — Ambulatory Visit (INDEPENDENT_AMBULATORY_CARE_PROVIDER_SITE_OTHER): Payer: BC Managed Care – PPO | Admitting: Urgent Care

## 2015-08-01 VITALS — BP 132/78 | HR 93 | Temp 98.4°F | Resp 15 | Ht 69.5 in | Wt 141.0 lb

## 2015-08-01 DIAGNOSIS — F329 Major depressive disorder, single episode, unspecified: Secondary | ICD-10-CM

## 2015-08-01 DIAGNOSIS — E785 Hyperlipidemia, unspecified: Secondary | ICD-10-CM | POA: Diagnosis not present

## 2015-08-01 DIAGNOSIS — Z0000 Encounter for general adult medical examination without abnormal findings: Secondary | ICD-10-CM | POA: Diagnosis not present

## 2015-08-01 DIAGNOSIS — J302 Other seasonal allergic rhinitis: Secondary | ICD-10-CM

## 2015-08-01 DIAGNOSIS — Z23 Encounter for immunization: Secondary | ICD-10-CM

## 2015-08-01 DIAGNOSIS — F32A Depression, unspecified: Secondary | ICD-10-CM

## 2015-08-01 LAB — COMPLETE METABOLIC PANEL WITH GFR
ALT: 23 U/L (ref 9–46)
AST: 21 U/L (ref 10–35)
Albumin: 4.7 g/dL (ref 3.6–5.1)
Alkaline Phosphatase: 49 U/L (ref 40–115)
BILIRUBIN TOTAL: 0.7 mg/dL (ref 0.2–1.2)
BUN: 11 mg/dL (ref 7–25)
CALCIUM: 9.9 mg/dL (ref 8.6–10.3)
CHLORIDE: 101 mmol/L (ref 98–110)
CO2: 27 mmol/L (ref 20–31)
CREATININE: 0.78 mg/dL (ref 0.70–1.25)
GFR, Est African American: >89 mL/min (ref >=60)
GFR, Est Non African American: >89 mL/min (ref >=60)
Glucose, Bld: 105 mg/dL — ABNORMAL HIGH (ref 65–99)
Potassium: 4.7 mmol/L (ref 3.5–5.3)
Sodium: 139 mmol/L (ref 135–146)
TOTAL PROTEIN: 6.9 g/dL (ref 6.1–8.1)

## 2015-08-01 LAB — CBC
HEMATOCRIT: 43.4 % (ref 38.5–50.0)
Hemoglobin: 14.6 g/dL (ref 13.2–17.1)
MCH: 29.7 pg (ref 27.0–33.0)
MCHC: 33.6 g/dL (ref 32.0–36.0)
MCV: 88.4 fL (ref 80.0–100.0)
MPV: 9.8 fL (ref 7.5–12.5)
PLATELETS: 205 10*3/uL (ref 140–400)
RBC: 4.91 MIL/uL (ref 4.20–5.80)
RDW: 13.2 % (ref 11.0–15.0)
WBC: 5.9 10*3/uL (ref 3.8–10.8)

## 2015-08-01 LAB — TSH: TSH: 2.11 mIU/L (ref 0.40–4.50)

## 2015-08-01 LAB — LIPID PANEL
CHOLESTEROL: 137 mg/dL (ref 125–200)
HDL: 69 mg/dL (ref >=40)
LDL Cholesterol: 55 mg/dL (ref <130)
Total CHOL/HDL Ratio: 2 Ratio (ref <=5.0)
Triglycerides: 65 mg/dL (ref <150)
VLDL: 13 mg/dL (ref <30)

## 2015-08-01 MED ORDER — FLUTICASONE PROPIONATE 50 MCG/ACT NA SUSP: 2.0000 | Freq: Every day | NASAL | Status: DC

## 2015-08-01 MED ORDER — ZOSTER VACCINE LIVE 19400 UNT/0.65ML ~~LOC~~ SUSR: 0.6500 mL | Freq: Once | SUBCUTANEOUS | Status: DC

## 2015-08-01 MED ORDER — ROSUVASTATIN CALCIUM 5 MG PO TABS: 5.0000 mg | ORAL_TABLET | Freq: Every day | ORAL | Status: DC

## 2015-08-01 NOTE — Progress Notes (Signed)
MRN: [id number]  Subjective:   [name] is a 65 y.o. male presenting for annual physical exam.  Medical care team includes: PCP: DOOLITTLE, Linton Ham, MD Vision: Wears glasses, has yearly eye exams. Dental: Has consistent dental care, currently has follow up scheduled within 2 weeks for recheck of a crown.  Specialists: None.   Patient is single, does not have any children. He used to work as a Software engineer, is now retired. Denies smoking cigarettes. Has ~2 drinks of alcohol per day. Eats healthily and exercises regularly.  Mood - Both his parents are in poor health. His mother has Alzheimer's disease, father had bypass heart surgery. He has 2 siblings that have not helped him take care of his parents. His dog was also euthenized in 03/2015, was very close to his dog. He also had a crown placed recently, causes him daily tooth pain. He takes ibuprofen for this. Patient has simply felt overwhelmed emotionally with everything that he has had to deal with. Denies SI, HI.   Chad Juarez has Other and unspecified hyperlipidemia on his problem list.  Chad Juarez has a current medication list which includes the following prescription(s): fluticasone, multivitamin, OVER THE COUNTER MEDICATION, rosuvastatin, aspirin, and cholecalciferol. He has No Known Allergies.  Chad Juarez  has a past medical history of GERD (gastroesophageal reflux disease) (07/2001); Ventral hernia; Diverticulosis (10/2004); History of Helicobacter pylori infection (2004); Basal cell carcinoma (11/2008); GI bleeding (09/2004); HLD (hyperlipidemia) (12/2002); History of kidney stones (04/1989); and Allergy. Also  has past surgical history that includes Colectomy (02/05/2010); Kidney stone surgery; and Tonsillectomy.  His family history includes Clotting disorder in his mother; Colon polyps in his father; Diabetes in his paternal grandfather; Heart disease in his father, maternal grandmother, mother, paternal grandfather, and sister;  Hodgkin's lymphoma in his other; Hyperlipidemia in his brother, mother, and sister; Hypertension in his father and mother; Mental illness in his mother.  Immunizations: last TDAP 06/23/2008  Review of Systems  Constitutional: Negative for fever, chills, weight loss, malaise/fatigue and diaphoresis.  HENT: Negative for congestion, ear discharge, ear pain, hearing loss, nosebleeds, sore throat and tinnitus.   Eyes: Negative for blurred vision, double vision, photophobia, pain, discharge and redness.  Respiratory: Negative for cough, shortness of breath and wheezing.   Cardiovascular: Negative for chest pain, palpitations and leg swelling.  Gastrointestinal: Negative for nausea, vomiting, abdominal pain, diarrhea, constipation and blood in stool.  Genitourinary: Negative for dysuria, urgency, frequency, hematuria and flank pain.  Musculoskeletal: Negative for myalgias, back pain and joint pain.  Skin: Negative for itching and rash.  Neurological: Negative for dizziness, tingling, seizures, loss of consciousness, weakness and headaches.  Endo/Heme/Allergies: Negative for polydipsia.  Psychiatric/Behavioral: Positive for depression. Negative for suicidal ideas, hallucinations, memory loss and substance abuse. The patient is not nervous/anxious and does not have insomnia.    Objective:   Vitals: BP 132/78 mmHg  Pulse 93  Temp(Src) 98.4 F (36.9 C) (Oral)  Resp 15  Ht 5' 9.5" (1.765 m)  Wt 141 lb (63.957 kg)  BMI 20.53 kg/m2  SpO2 97%  Physical Exam  Constitutional: He is oriented to person, place, and time. He appears well-developed and well-nourished.  HENT:  TM's intact bilaterally, no effusions or erythema. Nasal turbinates pink and moist, nasal passages patent. No sinus tenderness. Oropharynx clear, mucous membranes moist, dentition in good repair.  Eyes: Conjunctivae and EOM are normal. Pupils are equal, round, and reactive to light. Right eye exhibits no discharge. Left eye  exhibits no discharge.  No scleral icterus.  Neck: Normal range of motion. Neck supple. No thyromegaly present.  Cardiovascular: Normal rate, regular rhythm and intact distal pulses.  Exam reveals no gallop and no friction rub.   No murmur heard. Pulmonary/Chest: No stridor. No respiratory distress. He has no wheezes. He has no rales.  Abdominal: Soft. Bowel sounds are normal. He exhibits no distension and no mass. There is no tenderness.  Musculoskeletal: Normal range of motion. He exhibits no edema or tenderness.  Lymphadenopathy:    He has no cervical adenopathy.  Neurological: He is alert and oriented to person, place, and time. He has normal reflexes.  Skin: Skin is warm and dry. No rash noted. No erythema. No pallor.  Psychiatric: He has a normal mood and affect.  Patient has flat affect.   Assessment and Plan :   1. Annual physical exam - Labs pending, patient is a very pleasant person, medically healthy. - Discussed healthy lifestyle, diet, exercise, preventative care, vaccinations, and addressed patient's concerns.   2. Depression - Discussed medical and behavioral therapy. Patient will try to start therapy, yoga. He will let me know if he decides to start SSRI therapy like Prozac.  3. Seasonal allergies - Refill provided for 1 year. - fluticasone (FLONASE) 50 MCG/ACT nasal spray; Place 2 sprays into both nostrils daily.  Dispense: 16 g; Refill: 11  4. Hyperlipidemia - Refill provided to the patient for 1 year, labs pending - rosuvastatin (CRESTOR) 5 MG tablet; Take 1 tablet (5 mg total) by mouth daily.  Dispense: 90 tablet; Refill: 3  5. Need for shingles vaccine - Zoster Vaccine Live, PF, (ZOSTAVAX) 09811 UNT/0.65ML injection; Inject 19,400 Units into the skin once.  Dispense: 1 each; Refill: 0   Jaynee Eagles, PA-C [facility] [phone] 08/01/2015  8:43 AM

## 2015-08-01 NOTE — Patient Instructions (Addendum)
[facility] [place], [place] [address]   Burnard Leigh [phone]  Horton Finer [phone]  Everardo Beals [phone]     Keeping you healthy  Get these tests  Blood pressure- Have your blood pressure checked once a year by your healthcare provider.  Normal blood pressure is 120/80  Weight- Have your body mass index (BMI) calculated to screen for obesity.  BMI is a measure of body fat based on height and weight. You can also calculate your own BMI at ViewBanking.[email or web address].  Cholesterol- Have your cholesterol checked every year.  Diabetes- Have your blood sugar checked regularly if you have high blood pressure, high cholesterol, have a family history of diabetes or if you are overweight.  Screening for Colon Cancer- Colonoscopy starting at age 66.  Screening may begin sooner depending on your family history and other health conditions. Follow up colonoscopy as directed by your Gastroenterologist.  Screening for Prostate Cancer- Both blood work (PSA) and a rectal exam help screen for Prostate Cancer.  Screening begins at age 73 with African-American men and at age 12 with Caucasian men.  Screening may begin sooner depending on your family history.  Take these medicines  Aspirin- One aspirin daily can help prevent Heart disease and Stroke.  Flu shot- Every fall.  Tetanus- Every 10 years.  Zostavax- Once after the age of 56 to prevent Shingles.  Pneumonia shot- Once after the age of 48; if you are younger than 8, ask your healthcare provider if you need a Pneumonia shot.  Take these steps  Don't smoke- If you do smoke, talk to your doctor about quitting.  For tips on how to quit, go to www.smokefree.gov or call 1-800-QUIT-NOW.  Be physically active- Exercise 5 days a week for at least 30 minutes.  If you are not already physically active start slow and gradually work up to 30 minutes of moderate physical activity.  Examples of  moderate activity include walking briskly, mowing the yard, dancing, swimming, bicycling, etc.  Eat a healthy diet- Eat a variety of healthy food such as fruits, vegetables, low fat milk, low fat cheese, yogurt, lean meant, poultry, fish, beans, tofu, etc. For more information go to www.thenutritionsource.org  Drink alcohol in moderation- Limit alcohol intake to less than two drinks a day. Never drink and drive.  Dentist- Brush and floss twice daily; visit your dentist twice a year.  Depression- Your emotional health is as important as your physical health. If you're feeling down, or losing interest in things you would normally enjoy please talk to your healthcare provider.  Eye exam- Visit your eye doctor every year.  Safe sex- If you may be exposed to a sexually transmitted infection, use a condom.  Seat belts- Seat belts can save your life; always wear one.  Smoke/Carbon Monoxide detectors- These detectors need to be installed on the appropriate level of your home.  Replace batteries at least once a year.  Skin cancer- When out in the sun, cover up and use sunscreen 15 SPF or higher.  Violence- If anyone is threatening you, please tell your healthcare provider.  Living Will/ Health care power of attorney- Speak with your healthcare provider and family.    IF you received an x-ray today, you will receive an invoice from [facility]. Please contact [facility] at [phone] with questions or concerns regarding your invoice.   IF you received labwork today, you will receive an invoice from [employer]. Please contact [facility] at [phone] with  questions or concerns regarding your invoice.   Our billing staff will not be able to assist you with questions regarding bills from these companies.  You will be contacted with the lab results as soon as they are available. The fastest way to get your results is to activate your My Chart account.  Instructions are located on the last page of this paperwork. If you have not heard from [place] regarding the results in 2 weeks, please contact this office.

## 2015-08-02 LAB — PSA: PSA: 2.76 ng/mL (ref <=4.00)

## 2015-08-13 ENCOUNTER — Encounter: Payer: BC Managed Care – PPO | Admitting: Physician Assistant

## 2015-12-20 ENCOUNTER — Ambulatory Visit (INDEPENDENT_AMBULATORY_CARE_PROVIDER_SITE_OTHER): Payer: BC Managed Care – PPO | Admitting: Physician Assistant

## 2015-12-20 DIAGNOSIS — Z23 Encounter for immunization: Secondary | ICD-10-CM | POA: Diagnosis not present

## 2016-04-16 ENCOUNTER — Encounter: Payer: Self-pay | Admitting: Family Medicine

## 2016-04-16 ENCOUNTER — Other Ambulatory Visit: Payer: Self-pay | Admitting: Family Medicine

## 2016-04-16 ENCOUNTER — Ambulatory Visit (INDEPENDENT_AMBULATORY_CARE_PROVIDER_SITE_OTHER): Payer: BC Managed Care – PPO | Admitting: Family Medicine

## 2016-04-16 VITALS — BP 112/66 | HR 68 | Temp 98.8°F | Resp 18 | Ht 69.5 in | Wt 139.0 lb

## 2016-04-16 DIAGNOSIS — I493 Ventricular premature depolarization: Secondary | ICD-10-CM | POA: Diagnosis not present

## 2016-04-16 DIAGNOSIS — I499 Cardiac arrhythmia, unspecified: Secondary | ICD-10-CM | POA: Diagnosis not present

## 2016-04-16 MED ORDER — METOPROLOL TARTRATE 25 MG PO TABS: 12.5000 mg | ORAL_TABLET | Freq: Two times a day (BID) | ORAL | 0 refills | Status: DC | PRN

## 2016-04-16 NOTE — Progress Notes (Signed)
Subjective:    Patient ID: Chad Juarez, male    DOB: 06-Oct-1950, 66 y.o.   MRN: [id number] Chief Complaint  Patient presents with  . Irregular Heart Beat    HPI  Chad Juarez is a 66 yo male that presents with approx 6 wks of occ heart palpitations noted as a rapid fluttering in his throat.  He feels his heart pounding, a flutter in his throat, and then checks and beat is irreg. Stopped caffiene/coffee and PB dropped and occ down to 80s/50s.  INitially 1/4 HR 103 and then dropped to 90s and then in the 60s even during the irregular rate.  Hd episode of pounding of heart very severe twice when he was moving the lawn last summer.   No pressure/heaviness, a little SHoB when fluttering, no n/v.  Does get an occ left axialla ache that is intermittent. At times lightheaded/dizzy when bp low all since 1/4.  20 yrs ago he used to get epigastric pain radiationg to his back and up to his right jaw treated with drinking something.  Did have abd US done that was normal.  DId have a recurrent episode sev mos ago  Was taking some ibuprofen after a detnal episode ibuprofen from May to Jan 1 and then stopped.  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 Allergy   . Basal cell carcinoma 11/2008  . Diverticulosis 10/2004  . GERD (gastroesophageal reflux disease) 07/2001  . GI bleeding 09/2004  . History of Helicobacter pylori infection 2004  . History of kidney stones 04/1989  . HLD (hyperlipidemia) 12/2002  . Ventral hernia    Past Surgical History:  Procedure Laterality Date  . COLECTOMY  02/05/2010  . KIDNEY STONE SURGERY    . TONSILLECTOMY     Current Outpatient Prescriptions on File Prior to Visit  Medication Sig Dispense Refill  . aspirin 81 MG  tablet Take 81 mg by mouth daily. Reported on 08/01/2015    . fluticasone (FLONASE) 50 MCG/ACT nasal spray Place 2 sprays into both nostrils daily. 16 g 11  . Multiple Vitamin (MULTIVITAMIN) tablet Take 1 tablet by mouth daily.    Marland Kitchen OVER THE COUNTER MEDICATION Famotidine (Pepcid) 20 mg prn    . rosuvastatin (CRESTOR) 5 MG tablet Take 1 tablet (5 mg total) by mouth daily. 90 tablet 3  . Zoster Vaccine Live, PF, (ZOSTAVAX) 60454 UNT/0.65ML injection Inject 19,400 Units into the skin once. (Patient not taking: Reported on 04/16/2016) 1 each 0   No current facility-administered medications on file prior to visit.    No Known Allergies Family History  Problem Relation Age of Onset  . Colon polyps Father   . Hypertension Father   . Heart disease Father   . Clotting disorder Mother     pulmonary embolism  . Heart disease Mother   . Hyperlipidemia Mother   . Hypertension Mother   . Mental illness Mother   . Hodgkin's lymphoma Other     nephew  . Hyperlipidemia Brother   . Hyperlipidemia Sister   . Heart disease Sister   . Diabetes Paternal Grandfather   . Heart disease Paternal Grandfather   . Heart disease Maternal  Grandmother    Social History   Social History  . Marital status: Single    Spouse name: N/A  . Number of children: 0  . Years of education: N/A   Occupational History  . retired Software engineer Retired   Social History Main Topics  . Smoking status: Former Smoker    Quit date: 08/13/2001  . Smokeless tobacco: Never Used  . Alcohol use 0.0 oz/week     Comment: wine with dinner 10-14 drinks   . Drug use: No  . Sexual activity: Not Asked   Other Topics Concern  . None   Social History Narrative   Single. Education:    Exercise walking 2-4 miles/week    Depression screen [facility] 2/9 04/16/2016 08/01/2015 08/01/2014 07/26/2013  Decreased Interest 0 1 0 0  Down, Depressed, Hopeless 0 2 0 1  PHQ - 2 Score 0 3 0 1  Altered sleeping - 3 - -  Tired, decreased energy - 2 - -    Change in appetite - 1 - -  Feeling bad or failure about yourself  - 1 - -  Trouble concentrating - 0 - -  Moving slowly or fidgety/restless - 0 - -  Suicidal thoughts - 0 - -  PHQ-9 Score - 10 - -    Review of Systems  Constitutional: Negative for activity change, appetite change, chills, fatigue and fever.  HENT: Negative for sore throat and trouble swallowing.   Respiratory: Positive for shortness of breath. Negative for cough, chest tightness and wheezing.   Cardiovascular: Positive for palpitations. Negative for chest pain and leg swelling.  Gastrointestinal: Negative for abdominal pain, nausea and vomiting.  Allergic/Immunologic: Negative for immunocompromised state.  Neurological: Positive for light-headedness. Negative for dizziness, syncope, speech difficulty, weakness, numbness and headaches.  Psychiatric/Behavioral: Negative for confusion and dysphoric mood. The patient is not nervous/anxious.        Objective:   Physical Exam  Constitutional: He is oriented to person, place, and time. He appears well-developed and well-nourished. No distress.  HENT:  Head: Normocephalic and atraumatic.  Eyes: Conjunctivae are normal. Pupils are equal, round, and reactive to light. No scleral icterus.  Neck: Normal range of motion. Neck supple. No thyromegaly present.  Cardiovascular: Normal rate, normal heart sounds and intact distal pulses.  An irregularly irregular rhythm present.  No murmur heard. Pulmonary/Chest: Effort normal and breath sounds normal. No respiratory distress.  Musculoskeletal: He exhibits no edema.  Lymphadenopathy:    He has no cervical adenopathy.  Neurological: He is alert and oriented to person, place, and time.  Skin: Skin is warm and dry. He is not diaphoretic.  Psychiatric: He has a normal mood and affect. His behavior is normal.      BP 112/66 (BP Location: Right Arm, Patient Position: Sitting, Cuff Size: Small)   Pulse 68   Temp 98.8 F (37.1 C)  (Oral)   Resp 18   Ht 5' 9.5" (1.765 m)   Wt 139 lb (63 kg)   SpO2 100%   BMI 20.23 kg/m   [facility] reading (PRIMARY) by  Dr. Brigitte Pulse. EKG: NSR, no signs of strain or ischemic change, rhythm strip shows occ PVCs Assessment & Plan:   1. Irregular heartbeat   2. PVC (premature ventricular contraction)   Reassured pt but it is odd that he never had this sensation until 6 wks prior and now is occurring rather freq so will refer to cards for further eval. Also w/ occ left axillary pain that is connected  so will want to ensure this isn't an atypical anginal equivalent though doubtful. BP is already quite low so don't want to start scheduled BB as concern for hypotensive sxs but will try prn when his sxs are freq to see how well they respond.  Orders Placed This Encounter  Procedures  . CBC  . TSH  . Comprehensive metabolic panel  . Ambulatory referral to Cardiology    Referral Priority:   Routine    Referral Type:   Consultation    Referral Reason:   Specialty Services Required    Referred to Provider:   Belva Crome, MD    Requested Specialty:   Cardiology    Number of Visits Requested:   1  . EKG 12-Lead    Meds ordered this encounter  Medications  . metoprolol tartrate (LOPRESSOR) 25 MG tablet    Sig: Take 0.5 tablets (12.5 mg total) by mouth 2 (two) times daily as needed (palpitations).    Dispense:  60 tablet    Refill:  0     Delman Cheadle, M.D.  Primary Care at [facility] [address] [place], [place] [address] [phone] phone [phone] fax  04/17/16 1:06 PM

## 2016-04-16 NOTE — Patient Instructions (Addendum)
IF you received an x-ray today, you will receive an invoice from [facility]. Please contact [facility] at [phone] with questions or concerns regarding your invoice.   IF you received labwork today, you will receive an invoice from [facility]. Please contact [facility] at [phone] with questions or concerns regarding your invoice.   Our billing staff will not be able to assist you with questions regarding bills from these companies.  You will be contacted with the lab results as soon as they are available. The fastest way to get your results is to activate your My Chart account. Instructions are located on the last page of this paperwork. If you have not heard from [place] regarding the results in 2 weeks, please contact this office.     Premature Ventricular Contraction A premature ventricular contraction (PVC) is a common irregularity in the normal heart rhythm. These contractions are extra heartbeats that start in the heart ventricles and occur too early in the normal sequence. During the PVC, the heart's normal electrical pathway is not used, so the beat is shorter and less effective. In most cases, these contractions come and go and do not require treatment. What are the causes? In many cases, the cause may not be known. Common causes of the condition include:  Smoking.  Drinking alcohol.  Caffeine.  Certain medicines.  Some illegal drugs.  Stress. Certain medical conditions can also cause PVCs:  Changes in minerals in the blood (electrolytes).  Heart failure.  Heart valve problems.  Low blood oxygen levels or high carbon dioxide levels.  Heart attack, or coronary artery disease. What are the signs or symptoms? The main symptom of this condition is a fast or skipped heartbeat (palpitations). Other symptoms include:  Chest pain.  Shortness of breath.  Feeling tired.  Dizziness. In some cases, there are no symptoms. How is this  diagnosed? This condition may be diagnosed based on:  Your medical history.  A physical exam. During the exam, the health care provider will check for irregular heartbeats.  Tests, such as:  An ECG (electrocardiogram) to monitor the electrical activity of your heart.  Holter monitor testing. This involves wearing a device that clips to your clothing and monitors the electrical activity of your heart over longer periods of time.  Stress tests to see how exercise affects your heart rhythm and blood supply.  Echocardiogram. This test uses sound waves (ultrasound) to produce an image of your heart.  Electrophysiology study. This test checks the electric pathways in your heart. How is this treated? Treatment depends on any underlying conditions, the type of PVCs that you are having, and how much the symptoms are interfering with your daily life. Possible treatments include:  Avoiding things that can trigger the premature contractions, such as caffeine or alcohol.  Medicines. These may be given if symptoms are severe or if the extra heartbeats are frequent.  Treatment for any underlying condition that is found to be the cause of the contractions.  Catheter ablation. This procedure destroys the heart tissues that send abnormal signals. In some cases, no treatment is required. Follow these instructions at home: Lifestyle Follow these instructions as told by your health care provider:  Do not use any products that contain nicotine or tobacco, such as cigarettes and e-cigarettes. If you need help quitting, ask your health care provider.  If caffeine triggers episodes of PVC, do not eat, drink, or use anything with caffeine in it.  If caffeine does not seem to  trigger episodes, consume caffeine in moderation.  If alcohol triggers episodes of PVC, do not drink alcohol.  If alcohol does not seem to trigger episodes, limit alcohol intake to no more than 1 drink a day for nonpregnant women  and 2 drinks a day for men. One drink equals 12 oz of beer, 5 oz of wine, or 1 oz of hard liquor.  Exercise regularly. Ask your health care provider what type of exercise is safe for you.  Find healthy ways to manage stress. Avoid stressful situations when possible.  Try to get at least 7-9 hours of sleep each night, or as much as recommended by your health care provider.  Do not use illegal drugs. General instructions  Take over-the-counter and prescription medicines only as told by your health care provider.  Keep all follow-up visits as told by your health care provider. This is important. Get help right away if:  You feel palpitations that are frequent or continual.  You have chest pain.  You have shortness of breath.  You have sweating for no reason.  You have nausea and vomiting.  You become light-headed or you faint. This information is not intended to replace advice given to you by your health care provider. Make sure you discuss any questions you have with your health care provider. Document Released: 09/27/2003 Document Revised: 10/04/2015 Document Reviewed: 07/17/2015 Elsevier Interactive Patient Education  2017 [employer].

## 2016-04-17 LAB — COMPREHENSIVE METABOLIC PANEL
ALBUMIN: 5.1 g/dL — AB (ref 3.6–4.8)
ALT: 24 IU/L (ref 0–44)
AST: 21 IU/L (ref 0–40)
Albumin/Globulin Ratio: 2.6 — ABNORMAL HIGH (ref 1.2–2.2)
Alkaline Phosphatase: 67 IU/L (ref 39–117)
BILIRUBIN TOTAL: 0.5 mg/dL (ref 0.0–1.2)
BUN / CREAT RATIO: 15 (ref 10–24)
BUN: 10 mg/dL (ref 8–27)
CHLORIDE: 98 mmol/L (ref 96–106)
CO2: 28 mmol/L (ref 18–29)
CREATININE: 0.68 mg/dL — AB (ref 0.76–1.27)
Calcium: 9.9 mg/dL (ref 8.6–10.2)
GFR calc Af Amer: 117 mL/min/{1.73_m2} (ref >59)
GFR calc non Af Amer: 101 mL/min/{1.73_m2} (ref >59)
GLOBULIN, TOTAL: 2 g/dL (ref 1.5–4.5)
GLUCOSE: 103 mg/dL — AB (ref 65–99)
Potassium: 4.6 mmol/L (ref 3.5–5.2)
SODIUM: 142 mmol/L (ref 134–144)
TOTAL PROTEIN: 7.1 g/dL (ref 6.0–8.5)

## 2016-04-17 LAB — CBC
HEMATOCRIT: 45.2 % (ref 37.5–51.0)
Hemoglobin: 14.8 g/dL (ref 13.0–17.7)
MCH: 29.1 pg (ref 26.6–33.0)
MCHC: 32.7 g/dL (ref 31.5–35.7)
MCV: 89 fL (ref 79–97)
PLATELETS: 209 10*3/uL (ref 150–379)
RBC: 5.08 x10E6/uL (ref 4.14–5.80)
RDW: 13.2 % (ref 12.3–15.4)
WBC: 7.4 10*3/uL (ref 3.4–10.8)

## 2016-04-17 LAB — TSH: TSH: 1.58 u[IU]/mL (ref 0.450–4.500)

## 2016-05-07 ENCOUNTER — Encounter: Payer: Self-pay | Admitting: Interventional Cardiology

## 2016-05-17 DIAGNOSIS — I499 Cardiac arrhythmia, unspecified: Secondary | ICD-10-CM | POA: Insufficient documentation

## 2016-05-17 NOTE — Progress Notes (Signed)
Cardiology Office Note    Date:  05/19/2016   ID:  YORDIN RHODA, DOB 1950-11-29, MRN [id number]  PCP:  Delman Cheadle, MD  Cardiologist: Sinclair Grooms, MD   Chief Complaint  Patient presents with  . Palpitations  . Chest Pain    History of Present Illness:  Chad Juarez is a 66 y.o. male with irregular heart beat.  Chad Juarez has experienced abnormal increases in heart rate since last summer. There were 2 instances when shortly after morning his grass his heart rate accelerated, he felt lightheaded, palpitations, and anxiety. Each episode lasted 30 minutes or longer before spontaneously resolving.  Beginning in January, spontaneous episodes occurred, similar to those that were present after mowing. He denies associated chest pain, orthopnea, PND, and syncope. There is no dyspnea. He has not had PND.  He occasionally has vague left upper arm and left pectoral discomfort that can last seconds and then resolved. This is unassociated with changes in heart rate/rhythm.  I care for his father. His father has had coronary bypass grafting and elderly age. Mother also has a history of coronary disease with previous bypass.  After seeing PVCs on an office based rhythm strip, the primary care physician started metoprolol tartrate 12.5 mg twice a day. This is not changed the frequency of palpitations.  Past Medical History:  Diagnosis Date  . Allergy   . Basal cell carcinoma 11/2008  . Diverticulosis 10/2004  . GERD (gastroesophageal reflux disease) 07/2001  . GI bleeding 09/2004  . History of Helicobacter pylori infection 2004  . History of kidney stones 04/1989  . HLD (hyperlipidemia) 12/2002  . Ventral hernia     Past Surgical History:  Procedure Laterality Date  . COLECTOMY  02/05/2010  . KIDNEY STONE SURGERY    . TONSILLECTOMY      Current Medications: Outpatient Medications Prior to Visit  Medication Sig Dispense Refill  . aspirin 81 MG tablet Take 81 mg by mouth daily.  Reported on 08/01/2015    . fluticasone (FLONASE) 50 MCG/ACT nasal spray Place 2 sprays into both nostrils daily. (Patient taking differently: Place 2 sprays into both nostrils daily as needed. ) 16 g 11  . Multiple Vitamin (MULTIVITAMIN) tablet Take 1 tablet by mouth daily.    Marland Kitchen OVER THE COUNTER MEDICATION Famotidine (Pepcid) 20 mg prn    . rosuvastatin (CRESTOR) 5 MG tablet Take 1 tablet (5 mg total) by mouth daily. 90 tablet 3  . metoprolol tartrate (LOPRESSOR) 25 MG tablet Take 0.5 tablets (12.5 mg total) by mouth 2 (two) times daily as needed (palpitations). 60 tablet 0  . Zoster Vaccine Live, PF, (ZOSTAVAX) 73220 UNT/0.65ML injection Inject 19,400 Units into the skin once. (Patient not taking: Reported on 04/16/2016) 1 each 0   No facility-administered medications prior to visit.      Allergies:   Patient has no known allergies.   Social History   Social History  . Marital status: Single    Spouse name: N/A  . Number of children: 0  . Years of education: N/A   Occupational History  . retired Software engineer Retired   Social History Main Topics  . Smoking status: Former Smoker    Quit date: 08/13/2001  . Smokeless tobacco: Never Used  . Alcohol use 0.0 oz/week     Comment: wine with dinner 10-14 drinks   . Drug use: No  . Sexual activity: Not Asked   Other Topics Concern  . None   Social History Narrative  Single. Education:    Exercise walking 2-4 miles/week     Family History:  The patient's family history includes Clotting disorder in his mother; Colon polyps in his father; Diabetes in his paternal grandfather; Heart disease in his father, maternal grandmother, mother, paternal grandfather, and sister; Hodgkin's lymphoma in his other; Hyperlipidemia in his brother, mother, and sister; Hypertension in his father and mother; Mental illness in his mother.   ROS:   Please see the history of present illness.    Prior smoker. For 20 years but none for the past 20 years.  Physically active. No limitations. There is a history of sigmoid diverticulum that was associated with recurring GI complaints. Surgical resection has led to resolution. All other systems reviewed and are negative.   PHYSICAL EXAM:   VS:  BP 134/74   Pulse 74   Ht 5\' 9"  (1.753 m)   Wt 136 lb 3.2 oz (61.8 kg)   SpO2 98%   BMI 20.11 kg/m    GEN: Well nourished, well developed, in no acute distress  HEENT: normal  Neck: no JVD, carotid bruits, or masses Cardiac: RRR; no murmurs, rubs, or gallops,no edema  Respiratory:  clear to auscultation bilaterally, normal work of breathing GI: soft, nontender, nondistended, + BS MS: no deformity or atrophy  Skin: warm and dry, no rash Neuro:  Alert and Oriented x 3, Strength and sensation are intact Psych: euthymic mood, full affect  Wt Readings from Last 3 Encounters:  05/19/16 136 lb 3.2 oz (61.8 kg)  04/16/16 139 lb (63 kg)  08/01/15 141 lb (64 kg)      Studies/Labs Reviewed:   EKG:  EKG  Normal sinus rhythm, decreased R wave progression V1 and V2. Overall essentially normal tracing  ECG rhythm strip performed at the primary care physician's office 04/16/16 revealed uniform PVCs occurring irregularly. He was asymptomatic at the time.  Recent Labs: 08/01/2015: Hemoglobin 14.6 04/16/2016: ALT 24; BUN 10; Creatinine, Ser 0.68; Platelets 209; Potassium 4.6; Sodium 142; TSH 1.580   Lipid Panel    Component Value Date/Time   CHOL 137 08/01/2015 0846   TRIG 65 08/01/2015 0846   HDL 69 08/01/2015 0846   CHOLHDL 2.0 08/01/2015 0846   VLDL 13 08/01/2015 0846   LDLCALC 55 08/01/2015 0846    Additional studies/ records that were reviewed today include:  No recent cardiac data, imaging, or functional testing    ASSESSMENT:    1. Palpitations   2. Chest discomfort   3. Hyperlipidemia with target LDL less than 100      PLAN:  In order of problems listed above:  1. Thirty-day continuous monitor will be done to exclude sustained  arrhythmia possibly related to ischemic heart disease or atrial fibrillation which would place him at risk for embolic complications. 2. Atypical, beginning strong family history of CAD and personal risk factors of prior smoking and hyperlipidemia, a stress test will be performed. This may also help [place] to further evaluate possible exercise-induced arrhythmia has occurred when he was morning his grass. 3. Given risk factors, LDL target should be less than 70.  Plan is to discontinue beta blocker therapy. Perform an excess treadmill and 30 day monitor. Further evaluation will be dependent upon findings.  Medication Adjustments/Labs and Tests Ordered: Current medicines are reviewed at length with the patient today.  Concerns regarding medicines are outlined above.  Medication changes, Labs and Tests ordered today are listed in the Patient Instructions below. Patient Instructions  Medication Instructions:  1)  DISCONTINUE Metoprolol  Labwork: None  Testing/Procedures: Your physician has requested that you have an exercise tolerance test. For further information please visit [email or web address]. Please also follow instruction sheet, as given.  Your physician has recommended that you wear an event monitor. Event monitors are medical devices that record the heart's electrical activity. Doctors most often [place] these monitors to diagnose arrhythmias. Arrhythmias are problems with the speed or rhythm of the heartbeat. The monitor is a small, portable device. You can wear one while you do your normal daily activities. This is usually used to diagnose what is causing palpitations/syncope (passing out).    Follow-Up: Your physician recommends that you schedule a follow-up appointment as needed with Dr. Tamala Julian.    Any Other Special Instructions Will Be Listed Below (If Applicable).     If you need a refill on your cardiac medications before your next appointment, please call your pharmacy.       Signed, Sinclair Grooms, MD  05/19/2016 9:47 AM    [address], [place], [place]  [address] Phone: [phone]; Fax: [phone]

## 2016-05-19 ENCOUNTER — Encounter: Payer: Self-pay | Admitting: Interventional Cardiology

## 2016-05-19 ENCOUNTER — Ambulatory Visit (INDEPENDENT_AMBULATORY_CARE_PROVIDER_SITE_OTHER): Payer: BC Managed Care – PPO | Admitting: Interventional Cardiology

## 2016-05-19 VITALS — BP 134/74 | HR 74 | Ht 69.0 in | Wt 136.2 lb

## 2016-05-19 DIAGNOSIS — R002 Palpitations: Secondary | ICD-10-CM | POA: Diagnosis not present

## 2016-05-19 DIAGNOSIS — E785 Hyperlipidemia, unspecified: Secondary | ICD-10-CM | POA: Diagnosis not present

## 2016-05-19 DIAGNOSIS — R0789 Other chest pain: Secondary | ICD-10-CM

## 2016-05-19 NOTE — Patient Instructions (Addendum)
Medication Instructions:  1) DISCONTINUE Metoprolol  Labwork: None  Testing/Procedures: Your physician has requested that you have an exercise tolerance test. For further information please visit [email or web address]. Please also follow instruction sheet, as given.  Your physician has recommended that you wear an event monitor. Event monitors are medical devices that record the heart's electrical activity. Doctors most often [place] these monitors to diagnose arrhythmias. Arrhythmias are problems with the speed or rhythm of the heartbeat. The monitor is a small, portable device. You can wear one while you do your normal daily activities. This is usually used to diagnose what is causing palpitations/syncope (passing out).    Follow-Up: Your physician recommends that you schedule a follow-up appointment as needed with Dr. Tamala Julian.    Any Other Special Instructions Will Be Listed Below (If Applicable).     If you need a refill on your cardiac medications before your next appointment, please call your pharmacy.

## 2016-05-28 ENCOUNTER — Ambulatory Visit (INDEPENDENT_AMBULATORY_CARE_PROVIDER_SITE_OTHER): Payer: BC Managed Care – PPO

## 2016-05-28 DIAGNOSIS — R0789 Other chest pain: Secondary | ICD-10-CM

## 2016-05-28 DIAGNOSIS — R002 Palpitations: Secondary | ICD-10-CM | POA: Diagnosis not present

## 2016-05-28 LAB — EXERCISE TOLERANCE TEST
CHL CUP MPHR: 156 {beats}/min
CHL CUP RESTING HR STRESS: 69 {beats}/min
CSEPEW: 10.1 METS
Exercise duration (min): 9 min
Exercise duration (sec): 0 s
Peak HR: 150 {beats}/min
Percent HR: 96 %
RPE: 16

## 2016-06-22 ENCOUNTER — Telehealth: Payer: Self-pay | Admitting: Cardiovascular Disease

## 2016-06-22 ENCOUNTER — Telehealth: Payer: Self-pay | Admitting: Interventional Cardiology

## 2016-06-22 NOTE — Telephone Encounter (Signed)
Wrong provider

## 2016-06-22 NOTE — Telephone Encounter (Signed)
1. What dental office are you calling from? Dr Wyline Beady   2. What is your office phone and fax number? [phone] and fax is [phone] 3. What type of procedure is the patient having performed? Cleaning-pt is in the chair right now-need this asap  4. What date is procedure scheduled?  Today  5. What is your question (ex. Antibiotics prior to procedure, holding medication-we need to know how long dentist wants pt to hold med)? Does pt needs pre medication before cleaning

## 2016-06-22 NOTE — Telephone Encounter (Signed)
I advised Rosanna at Dr Lurena Joiner Johnson's office that according to our records there is no indication for endocarditis prophylaxis  according to American Heart Association guidelines.

## 2016-07-04 ENCOUNTER — Encounter: Payer: Self-pay | Admitting: Interventional Cardiology

## 2016-07-17 ENCOUNTER — Other Ambulatory Visit: Payer: Self-pay | Admitting: Urgent Care

## 2016-07-17 DIAGNOSIS — E785 Hyperlipidemia, unspecified: Secondary | ICD-10-CM

## 2016-08-13 ENCOUNTER — Encounter: Payer: Self-pay | Admitting: Family Medicine

## 2016-08-13 ENCOUNTER — Ambulatory Visit (INDEPENDENT_AMBULATORY_CARE_PROVIDER_SITE_OTHER): Payer: BC Managed Care – PPO | Admitting: Family Medicine

## 2016-08-13 VITALS — BP 134/74 | HR 67 | Temp 98.6°F | Resp 16 | Ht 69.5 in | Wt 139.2 lb

## 2016-08-13 DIAGNOSIS — Z1389 Encounter for screening for other disorder: Secondary | ICD-10-CM | POA: Diagnosis not present

## 2016-08-13 DIAGNOSIS — Z1383 Encounter for screening for respiratory disorder NEC: Secondary | ICD-10-CM | POA: Diagnosis not present

## 2016-08-13 DIAGNOSIS — Z113 Encounter for screening for infections with a predominantly sexual mode of transmission: Secondary | ICD-10-CM | POA: Diagnosis not present

## 2016-08-13 DIAGNOSIS — Z23 Encounter for immunization: Secondary | ICD-10-CM | POA: Diagnosis not present

## 2016-08-13 DIAGNOSIS — E785 Hyperlipidemia, unspecified: Secondary | ICD-10-CM | POA: Diagnosis not present

## 2016-08-13 DIAGNOSIS — Z136 Encounter for screening for cardiovascular disorders: Secondary | ICD-10-CM

## 2016-08-13 DIAGNOSIS — Z1329 Encounter for screening for other suspected endocrine disorder: Secondary | ICD-10-CM

## 2016-08-13 DIAGNOSIS — Z0000 Encounter for general adult medical examination without abnormal findings: Secondary | ICD-10-CM

## 2016-08-13 DIAGNOSIS — E7800 Pure hypercholesterolemia, unspecified: Secondary | ICD-10-CM

## 2016-08-13 DIAGNOSIS — Z1212 Encounter for screening for malignant neoplasm of rectum: Secondary | ICD-10-CM

## 2016-08-13 DIAGNOSIS — Z125 Encounter for screening for malignant neoplasm of prostate: Secondary | ICD-10-CM | POA: Diagnosis not present

## 2016-08-13 DIAGNOSIS — Z1211 Encounter for screening for malignant neoplasm of colon: Secondary | ICD-10-CM

## 2016-08-13 DIAGNOSIS — J302 Other seasonal allergic rhinitis: Secondary | ICD-10-CM | POA: Diagnosis not present

## 2016-08-13 DIAGNOSIS — Z130 Encounter for screening for diseases of the blood and blood-forming organs and certain disorders involving the immune mechanism: Secondary | ICD-10-CM

## 2016-08-13 LAB — POCT URINALYSIS DIP (MANUAL ENTRY)
Bilirubin, UA: NEGATIVE
Blood, UA: NEGATIVE
GLUCOSE UA: NEGATIVE mg/dL
NITRITE UA: NEGATIVE
Protein Ur, POC: NEGATIVE mg/dL
UROBILINOGEN UA: 0.2 U/dL
pH, UA: 5 (ref 5.0–8.0)

## 2016-08-13 NOTE — Patient Instructions (Addendum)
   IF you received an x-ray today, you will receive an invoice from [facility]. Please contact [facility] at [phone] with questions or concerns regarding your invoice.   IF you received labwork today, you will receive an invoice from [facility]. Please contact [facility] at [phone] with questions or concerns regarding your invoice.   Our billing staff will not be able to assist you with questions regarding bills from these companies.  You will be contacted with the lab results as soon as they are available. The fastest way to get your results is to activate your My Chart account. Instructions are located on the last page of this paperwork. If you have not heard from us regarding the results in 2 weeks, please contact this office.      Health Maintenance, Male A healthy lifestyle and preventive care is important for your health and wellness. Ask your health care provider about what schedule of regular examinations is right for you. What should I know about weight and diet? Eat a Healthy Diet  Eat plenty of vegetables, fruits, whole grains, low-fat dairy products, and lean protein.  Do not eat a lot of foods high in solid fats, added sugars, or salt.  Maintain a Healthy Weight Regular exercise can help you achieve or maintain a healthy weight. You should:  Do at least 150 minutes of exercise each week. The exercise should increase your heart rate and make you sweat (moderate-intensity exercise).  Do strength-training exercises at least twice a week.  Watch Your Levels of Cholesterol and Blood Lipids  Have your blood tested for lipids and cholesterol every 5 years starting at 65 years of age. If you are at high risk for heart disease, you should start having your blood tested when you are 65 years old. You may need to have your cholesterol levels checked more often if: ? Your lipid or cholesterol levels are high. ? You are older than 65 years of age. ? You  are at high risk for heart disease.  What should I know about cancer screening? Many types of cancers can be detected early and may often be prevented. Lung Cancer  You should be screened every year for lung cancer if: ? You are a current smoker who has smoked for at least 30 years. ? You are a former smoker who has quit within the past 15 years.  Talk to your health care provider about your screening options, when you should start screening, and how often you should be screened.  Colorectal Cancer  Routine colorectal cancer screening usually begins at 65 years of age and should be repeated every 5-10 years until you are 65 years old. You may need to be screened more often if early forms of precancerous polyps or small growths are found. Your health care provider may recommend screening at an earlier age if you have risk factors for colon cancer.  Your health care provider may recommend using home test kits to check for hidden blood in the stool.  A small camera at the end of a tube can be used to examine your colon (sigmoidoscopy or colonoscopy). This checks for the earliest forms of colorectal cancer.  Prostate and Testicular Cancer  Depending on your age and overall health, your health care provider may do certain tests to screen for prostate and testicular cancer.  Talk to your health care provider about any symptoms or concerns you have about testicular or prostate cancer.  Skin Cancer  Check your skin   from head to toe regularly.  Tell your health care provider about any new moles or changes in moles, especially if: ? There is a change in a mole's size, shape, or color. ? You have a mole that is larger than a pencil eraser.  Always use sunscreen. Apply sunscreen liberally and repeat throughout the day.  Protect yourself by wearing long sleeves, pants, a wide-brimmed hat, and sunglasses when outside.  What should I know about heart disease, diabetes, and high blood  pressure?  If you are 18-39 years of age, have your blood pressure checked every 3-5 years. If you are 40 years of age or older, have your blood pressure checked every year. You should have your blood pressure measured twice-once when you are at a hospital or clinic, and once when you are not at a hospital or clinic. Record the average of the two measurements. To check your blood pressure when you are not at a hospital or clinic, you can use: ? An automated blood pressure machine at a pharmacy. ? A home blood pressure monitor.  Talk to your health care provider about your target blood pressure.  If you are between 45-79 years old, ask your health care provider if you should take aspirin to prevent heart disease.  Have regular diabetes screenings by checking your fasting blood sugar level. ? If you are at a normal weight and have a low risk for diabetes, have this test once every three years after the age of 45. ? If you are overweight and have a high risk for diabetes, consider being tested at a younger age or more often.  A one-time screening for abdominal aortic aneurysm (AAA) by ultrasound is recommended for men aged 65-75 years who are current or former smokers. What should I know about preventing infection? Hepatitis B If you have a higher risk for hepatitis B, you should be screened for this virus. Talk with your health care provider to find out if you are at risk for hepatitis B infection. Hepatitis C Blood testing is recommended for:  Everyone born from 1945 through 1965.  Anyone with known risk factors for hepatitis C.  Sexually Transmitted Diseases (STDs)  You should be screened each year for STDs including gonorrhea and chlamydia if: ? You are sexually active and are younger than 65 years of age. ? You are older than 65 years of age and your health care provider tells you that you are at risk for this type of infection. ? Your sexual activity has changed since you were last  screened and you are at an increased risk for chlamydia or gonorrhea. Ask your health care provider if you are at risk.  Talk with your health care provider about whether you are at high risk of being infected with HIV. Your health care provider may recommend a prescription medicine to help prevent HIV infection.  What else can I do?  Schedule regular health, dental, and eye exams.  Stay current with your vaccines (immunizations).  Do not use any tobacco products, such as cigarettes, chewing tobacco, and e-cigarettes. If you need help quitting, ask your health care provider.  Limit alcohol intake to no more than 2 drinks per day. One drink equals 12 ounces of beer, 5 ounces of wine, or 1 ounces of hard liquor.  Do not use street drugs.  Do not share needles.  Ask your health care provider for help if you need support or information about quitting drugs.  Tell your health care   provider if you often feel depressed.  Tell your health care provider if you have ever been abused or do not feel safe at home. This information is not intended to replace advice given to you by your health care provider. Make sure you discuss any questions you have with your health care provider. Document Released: 08/08/2007 Document Revised: 10/09/2015 Document Reviewed: 11/13/2014 Elsevier Interactive Patient Education  2018 [employer]  

## 2016-08-13 NOTE — Progress Notes (Signed)
Subjective:    Patient ID: Chad Juarez, male    DOB: 01/17/1951, 66 y.o.   MRN: [id number] Chief Complaint  Patient presents with  . Annual Exam    HPI Chad Juarez is a 66 y.o. male presenting for annual physical exam.  Still has occ episodes of palpitations though not as often though more likely to get when he over exerts and overhearts.  When he does get episodes they do tend to come more freq. The metoprolol made him feels really washed out and fatigued so no desire to take prn.   Medical care team includes: PCP: Delman Cheadle, myself. Vision: Wears glasses, has yearly eye exams. Sees [place] [facility] - Dr. Yvonne Kendall  Dental: Has consistent dental care, currently has follow up scheduled within 2 weeks for recheck of a crown.  Specialists: derm, [facility] (transferred care there from [facility] who did last colonoscopy, they already have his records as [facility] did his endoscopy 5 yrs ago or so).              Patient is single, does not have any children. 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 Denies smoking cigarettes though has smoked > 100cigs in lifetime (smoked in college when it was everywhere - quitting was the hardeset thing he has ever done.) Eats healthily and exercises regularly. Does have 12-14 drinks/wk (2 glasses of wine w/ dinner).  Has a huge diverticulum - but no polyps. Plans for next colnoscopy in 2021 with [place]  Lab Results  Component Value Date   PSA 2.76 08/01/2015   PSA 3.63 08/01/2014   PSA 3.63 08/01/2014   PSA 2.13 07/26/2013   PSA 2.11 06/29/2012  Has intermittent dribbling or urinary restriction but unchanged and not sig enough to need med, no nocturia.     Did not get zostavax though he meant to- will do shingrix next yr instead.  Past Medical History:  Diagnosis Date  . Allergy   . Basal cell carcinoma 11/2008  . Depression   . Diverticulosis 10/2004  . GERD (gastroesophageal reflux disease) 07/2001   . GI bleeding 09/2004  . History of Helicobacter pylori infection 2004  . History of kidney stones 04/1989  . HLD (hyperlipidemia) 12/2002  . Ventral hernia    Past Surgical History:  Procedure Laterality Date  . COLECTOMY  02/05/2010  . KIDNEY STONE SURGERY    . TONSILLECTOMY     Current Outpatient Prescriptions on File Prior to Visit  Medication Sig Dispense Refill  . aspirin 81 MG tablet Take 81 mg by mouth daily. Reported on 08/01/2015    . fluticasone (FLONASE) 50 MCG/ACT nasal spray Place 2 sprays into both nostrils daily. (Patient taking differently: Place 2 sprays into both nostrils daily as needed. ) 16 g 11  . Multiple Vitamin (MULTIVITAMIN) tablet Take 1 tablet by mouth daily.    . rosuvastatin (CRESTOR) 5 MG tablet TAKE 1 TABLET (5 MG TOTAL) BY MOUTH DAILY. 90 tablet 0  . OVER THE COUNTER MEDICATION Famotidine (Pepcid) 20 mg prn     No current facility-administered medications on file prior to visit.    No Known Allergies Family History  Problem Relation Age of Onset  . Colon polyps Father   . Hypertension Father   . Heart disease Father   . Hyperlipidemia Father   . Clotting disorder Mother        pulmonary embolism  .  Heart disease Mother   . Hyperlipidemia Mother   . Hypertension Mother   . Mental illness Mother   . Hodgkin's lymphoma Other        nephew  . Hyperlipidemia Brother   . Hyperlipidemia Sister   . Heart disease Sister   . Diabetes Paternal Grandfather   . Heart disease Paternal Grandfather   . Hyperlipidemia Paternal Grandfather   . Hypertension Paternal Grandfather   . Stroke Paternal Grandfather   . Heart disease Maternal Grandmother   . Hyperlipidemia Maternal Grandmother   . Heart disease Maternal Grandfather   . Hyperlipidemia Maternal Grandfather   . Hypertension Maternal Grandfather   . Heart disease Paternal Grandmother   . Hyperlipidemia Paternal Grandmother    Social History   Social History  . Marital status: Single     Spouse name: N/A  . Number of children: 0  . Years of education: N/A   Occupational History  . retired Software engineer Retired   Social History Main Topics  . Smoking status: Former Smoker    Quit date: 08/13/2001  . Smokeless tobacco: Never Used  . Alcohol use 7.2 - 8.4 oz/week    12 - 14 Standard drinks or equivalent per week     Comment: wine with dinner 10-14 drinks   . Drug use: No  . Sexual activity: Not Asked   Other Topics Concern  . None   Social History Narrative   Single. Education:    Exercise walking 2-4 miles/week   Depression screen [facility] 2/9 08/13/2016 04/16/2016 08/01/2015 08/01/2014 07/26/2013  Decreased Interest 0 0 1 0 0  Down, Depressed, Hopeless 0 0 2 0 1  PHQ - 2 Score 0 0 3 0 1  Altered sleeping - - 3 - -  Tired, decreased energy - - 2 - -  Change in appetite - - 1 - -  Feeling bad or failure about yourself  - - 1 - -  Trouble concentrating - - 0 - -  Moving slowly or fidgety/restless - - 0 - -  Suicidal thoughts - - 0 - -  PHQ-9 Score - - 10 - -      Review of Systems  HENT: Positive for congestion.   Cardiovascular: Positive for palpitations.  Psychiatric/Behavioral: Positive for sleep disturbance.  All other systems reviewed and are negative.      Objective:   Physical Exam  Constitutional: He is oriented to person, place, and time. He appears well-developed and well-nourished. No distress.  HENT:  Head: Normocephalic and atraumatic.  Right Ear: Tympanic membrane, external ear and ear canal normal.  Left Ear: Tympanic membrane, external ear and ear canal normal.  Nose: Nose normal.  Mouth/Throat: Uvula is midline, oropharynx is clear and moist and mucous membranes are normal. No oropharyngeal exudate.  Eyes: Conjunctivae are normal. Right eye exhibits no discharge. Left eye exhibits no discharge. No scleral icterus.  Neck: Normal range of motion. Neck supple. No thyromegaly present.  Cardiovascular: Normal rate, regular rhythm, normal heart sounds  and intact distal pulses.   Pulmonary/Chest: Effort normal and breath sounds normal. No respiratory distress.  Abdominal: Soft. Bowel sounds are normal. He exhibits no distension and no mass. There is no tenderness. There is no rebound and no guarding.  Genitourinary: Prostate is enlarged (mildly enlarged, more tall then wide, firm, no nodules/masses/asymmetry palp). Prostate is not tender.  Musculoskeletal: He exhibits no edema.  Lymphadenopathy:    He has no cervical adenopathy.  Neurological: He is alert and oriented  to person, place, and time. No cranial nerve deficit (gross). He exhibits normal muscle tone.  Skin: Skin is warm and dry. No rash noted. He is not diaphoretic. No erythema.  Psychiatric: He has a normal mood and affect. His behavior is normal.   BP 134/74   Pulse 67   Temp 98.6 F (37 C) (Oral)   Resp 16   Ht 5' 9.5" (1.765 m)   Wt 139 lb 3.2 oz (63.1 kg)   SpO2 98%   BMI 20.26 kg/m       Visual Acuity Screening   Right eye Left eye Both eyes  Without correction:     With correction: 20/20 20/20 20/15     Assessment & Plan:  [place] AAA after 65 yo.  1. Annual physical exam   2. Screening for cardiovascular, respiratory, and genitourinary diseases   3. Screening for colorectal cancer   4. Screening for deficiency anemia   5. Screening for prostate cancer   6. Screening for thyroid disorder   7. Hyperlipidemia with target LDL less than 100 - refilled crestor  8. Routine screening for STI (sexually transmitted infection) - routine hiv/hep c as baby boomer but denies any sig risk factors or current exposure   Will need shingrix - ok to wait for next yr or call for rx if he prefers.   Tetanus updated today since medicare doesn't pay  Orders Placed This Encounter  Procedures  . Tdap vaccine greater than or equal to 7yo IM  . Lipid panel    Order Specific Question:   Has the patient fasted?    Answer:   Yes  . Comprehensive metabolic panel    Order Specific  Question:   Has the patient fasted?    Answer:   Yes  . PSA  . HIV antibody  . HCV Ab w/Rflx to Verification  . HIV antibody  . HCV Ab w/Rflx to Verification  . Interpretation:  . POCT urinalysis dipstick      Delman Cheadle, M.D.  Primary Care at [facility] [address] [place], [place] [address] [phone] phone [phone] fax  08/14/16 2:43 PM

## 2016-08-14 LAB — LIPID PANEL
CHOLESTEROL TOTAL: 153 mg/dL (ref 100–199)
Chol/HDL Ratio: 2.3 ratio (ref 0.0–5.0)
HDL: 68 mg/dL (ref >39)
LDL Calculated: 74 mg/dL (ref 0–99)
Triglycerides: 55 mg/dL (ref 0–149)
VLDL CHOLESTEROL CAL: 11 mg/dL (ref 5–40)

## 2016-08-14 LAB — COMPREHENSIVE METABOLIC PANEL
ALBUMIN: 4.8 g/dL (ref 3.6–4.8)
ALK PHOS: 70 IU/L (ref 39–117)
ALT: 28 IU/L (ref 0–44)
AST: 26 IU/L (ref 0–40)
Albumin/Globulin Ratio: 2.4 — ABNORMAL HIGH (ref 1.2–2.2)
BUN/Creatinine Ratio: 17 (ref 10–24)
BUN: 13 mg/dL (ref 8–27)
Bilirubin Total: 0.7 mg/dL (ref 0.0–1.2)
CO2: 25 mmol/L (ref 20–29)
CREATININE: 0.75 mg/dL — AB (ref 0.76–1.27)
Calcium: 10.1 mg/dL (ref 8.6–10.2)
Chloride: 99 mmol/L (ref 96–106)
GFR calc non Af Amer: 97 mL/min/{1.73_m2} (ref >59)
GFR, EST AFRICAN AMERICAN: 112 mL/min/{1.73_m2} (ref >59)
GLOBULIN, TOTAL: 2 g/dL (ref 1.5–4.5)
Glucose: 89 mg/dL (ref 65–99)
Potassium: 4.7 mmol/L (ref 3.5–5.2)
SODIUM: 140 mmol/L (ref 134–144)
TOTAL PROTEIN: 6.8 g/dL (ref 6.0–8.5)

## 2016-08-14 LAB — HCV AB W/RFLX TO VERIFICATION: HCV Ab: <0.1 s/co ratio (ref 0.0–0.9)

## 2016-08-14 LAB — HIV ANTIBODY (ROUTINE TESTING W REFLEX): HIV Screen 4th Generation wRfx: NONREACTIVE

## 2016-08-14 LAB — HCV INTERPRETATION

## 2016-08-14 LAB — PSA: Prostate Specific Ag, Serum: 2.6 ng/mL (ref 0.0–4.0)

## 2016-08-14 MED ORDER — FLUTICASONE PROPIONATE 50 MCG/ACT NA SUSP: 2.0000 | Freq: Every day | NASAL | 11 refills | Status: DC | PRN

## 2016-08-14 MED ORDER — ROSUVASTATIN CALCIUM 5 MG PO TABS: 5.0000 mg | ORAL_TABLET | Freq: Every day | ORAL | 3 refills | Status: DC

## 2016-10-29 ENCOUNTER — Ambulatory Visit: Payer: BC Managed Care – PPO | Admitting: Family Medicine

## 2016-11-01 NOTE — Progress Notes (Unsigned)
Pt not seen.  Rescheduled.

## 2016-12-18 ENCOUNTER — Ambulatory Visit (INDEPENDENT_AMBULATORY_CARE_PROVIDER_SITE_OTHER): Payer: Medicare Other | Admitting: Urgent Care

## 2016-12-18 DIAGNOSIS — Z23 Encounter for immunization: Secondary | ICD-10-CM

## 2017-08-10 ENCOUNTER — Ambulatory Visit: Payer: Medicare Other

## 2017-08-19 ENCOUNTER — Ambulatory Visit (INDEPENDENT_AMBULATORY_CARE_PROVIDER_SITE_OTHER): Payer: Medicare Other | Admitting: Family Medicine

## 2017-08-19 ENCOUNTER — Other Ambulatory Visit: Payer: Self-pay

## 2017-08-19 ENCOUNTER — Encounter: Payer: Self-pay | Admitting: Family Medicine

## 2017-08-19 VITALS — BP 122/70 | HR 68 | Temp 97.6°F | Resp 16 | Ht 69.69 in | Wt 142.0 lb

## 2017-08-19 DIAGNOSIS — Z0000 Encounter for general adult medical examination without abnormal findings: Secondary | ICD-10-CM

## 2017-08-19 DIAGNOSIS — J302 Other seasonal allergic rhinitis: Secondary | ICD-10-CM | POA: Diagnosis not present

## 2017-08-19 DIAGNOSIS — Z0001 Encounter for general adult medical examination with abnormal findings: Secondary | ICD-10-CM

## 2017-08-19 DIAGNOSIS — Z136 Encounter for screening for cardiovascular disorders: Secondary | ICD-10-CM

## 2017-08-19 DIAGNOSIS — E785 Hyperlipidemia, unspecified: Secondary | ICD-10-CM

## 2017-08-19 DIAGNOSIS — N403 Nodular prostate with lower urinary tract symptoms: Secondary | ICD-10-CM

## 2017-08-19 DIAGNOSIS — E7800 Pure hypercholesterolemia, unspecified: Secondary | ICD-10-CM

## 2017-08-19 DIAGNOSIS — Z125 Encounter for screening for malignant neoplasm of prostate: Secondary | ICD-10-CM

## 2017-08-19 DIAGNOSIS — R3129 Other microscopic hematuria: Secondary | ICD-10-CM | POA: Diagnosis not present

## 2017-08-19 DIAGNOSIS — H6123 Impacted cerumen, bilateral: Secondary | ICD-10-CM | POA: Diagnosis not present

## 2017-08-19 DIAGNOSIS — Z87891 Personal history of nicotine dependence: Secondary | ICD-10-CM

## 2017-08-19 LAB — POCT URINALYSIS DIP (MANUAL ENTRY)
Bilirubin, UA: NEGATIVE
Glucose, UA: NEGATIVE mg/dL
Ketones, POC UA: NEGATIVE mg/dL
Nitrite, UA: NEGATIVE
PROTEIN UA: NEGATIVE mg/dL
SPEC GRAV UA: 1.015 (ref 1.010–1.025)
UROBILINOGEN UA: 0.2 U/dL
pH, UA: 6 (ref 5.0–8.0)

## 2017-08-19 MED ORDER — FLUTICASONE PROPIONATE 50 MCG/ACT NA SUSP: 2.0000 | Freq: Every day | NASAL | 11 refills | Status: DC | PRN

## 2017-08-19 MED ORDER — ROSUVASTATIN CALCIUM 5 MG PO TABS: 5.0000 mg | ORAL_TABLET | Freq: Every day | ORAL | 3 refills | Status: DC

## 2017-08-19 MED ORDER — ZOSTER VAC RECOMB ADJUVANTED 50 MCG/0.5ML IM SUSR: 0.5000 mL | Freq: Once | INTRAMUSCULAR | 1 refills | Status: AC

## 2017-08-19 NOTE — Patient Instructions (Addendum)
IF you received an x-ray today, you will receive an invoice from [facility]. Please contact [facility] at [phone] with questions or concerns regarding your invoice.   IF you received labwork today, you will receive an invoice from [facility]. Please contact [facility] at [phone] with questions or concerns regarding your invoice.   Our billing staff will not be able to assist you with questions regarding bills from these companies.  You will be contacted with the lab results as soon as they are available. The fastest way to get your results is to activate your My Chart account. Instructions are located on the last page of this paperwork. If you have not heard from [place] regarding the results in 2 weeks, please contact this office.      Eating Plan for Brain Health A healthy diet is an important part of overall wellness, and it may help to improve brain function. Eating a healthy diet can lower the risk of having memory-loss diseases such as Alzheimer disease or dementia, or it can slow down the progression of those diseases. Depending on your overall health and any other health conditions you have, you may need to follow specific diet guidelines. Work with your health care provider or diet and nutrition specialist (dietitian) to find and follow an eating plan that is right for you. What are tips for following this plan? Reading food labels  Check food labels for Daily Value (DV) percentages. Aim for a DV of 5% or less for: ? Saturated fats. ? Trans fats. ? Cholesterol. ? Salt (sodium).  Choose whole grains instead of processed grains. The first ingredient in the ingredient list should include words like "whole grain" or "whole wheat." Shopping  Before you go grocery shopping, plan your meals and make a shopping list.  Look for lean meats, fish, low-fat dairy products, fruits and vegetables, and whole grains.  Avoid buying processed or pre-made foods. These  are higher in added sugar, fat, and sodium. Cooking  Use healthy oils such as olive oil, instead of butter or margarine.  Avoid frying foods. Healthier ways of cooking include roasting, baking, poaching, and steaming.  Keep in mind that many healthy foods can be prepared without cooking, such as tuna, nuts, beans, and fruits. Meal planning  Plan to eat one or more servings of each of these every day: ? Green leafy vegetables. ? Nuts. ? Whole grains.  Plan to eat berries, beans, fish, and poultry two or more times every week.  Avoid red meats, butter, cheese, sweets, and fried foods.  Eat several small meals throughout the day. For example, eat 6 small meals rather than 3 full-size meals. General tips  If you have difficulty chewing or swallowing: ? Choose foods that are tender, soft, and moist. ? Avoid foods that are sticky, hard, dry, chewy, or crunchy. ? Cut foods into pieces that are smaller than your thumbnail.  If you are underweight: ? Add extra protein or calories to meals by adding cream, nut butters, or protein powder to foods and drinks. ? Drink nutritional supplement shakes as told by your health care provider or dietitian.  Drink plenty of fluids to keep your urine pale yellow.  Limit alcohol intake to no more than 1 drink a day for nonpregnant women and 2 drinks a day for men. One drink equals 12 oz of beer, 5 oz of wine, or 1 oz of hard liquor.  Take daily vitamin and mineral supplements as told by your health  care provider or dietitian.  Follow daily calorie and nutrient intake goals as told by your dietitian. What foods are recommended?  Foods that are high in omega-3s (omega-3 fatty acids). Omega-3s are often found in coldwater fish. They are also found in ground flaxseed, walnuts, edamame, and seaweed. ? It is recommended that adults eat 8 oz (230 g) or more of fish or other seafood every week. ? The best kinds of fish for omega-3s are wild salmon,  albacore, tuna, sardines, and farmed trout. ? It is important to bake or grill fish instead of frying it, to avoid unhealthy fats.  Foods that contain vitamins C, D, and E. These foods include: ? Avocados. ? Beans. ? Nuts and seeds. ? Green leafy vegetables, like spinach and kale. ? Cruciferous vegetables, like broccoli or Brussels sprouts.  Cherries and berries, especially blackberries and blueberries. Berries have antioxidants and nutrients that support memory.  Whole grains, such as oatmeal, whole-grain cereal, whole-grain bread, brown rice, and barley soup.  Herbs and spices. Cooking with certain herbs and spices, such as turmeric, can help you absorb vitamins.  Foods in the [place] diet or the DASH (Dietary Approaches to Stop Hypertension) diet. Your health care provider may recommend eating a combination of these diets. These diets recommend that you: ? Eat green leafy vegetables, nuts, and whole grains every day. ? Drink one glass of wine a day. ? Eat berries, beans, fish, and poultry two or more times every week. ? Limit red meats, butter, cheese, sweets, fried foods, and fast food to once a week or less.  Healthy breakfast foods, such as whole-grain cereal, low-fat yogurt, berries or vegetables, and nuts. Eating breakfast every day can boost brain function and concentration for people of all ages. What foods are not recommended?  Foods that are high in trans fats, including: ? Fried foods. ? Snack foods. ? Pastries like cookies and donuts, especially the ones that come prepackaged.  Foods that are high in saturated fats, including: ? Processed meats, like sausage or deli meat. ? Red meat. ? Certain dairy products like butter, margarine, and certain cheeses.  Processed grains, such as white bread.  Sweets and fast food. Limit these types of food to once a week or less. Summary  Eating a nutritious diet can support brain health as well as overall  wellness.  Choose foods that are high in omega-3 fatty acids, vitamins, and whole grains.  Avoid foods that contain trans fats and saturated fats, and limit sweets and fast food. This information is not intended to replace advice given to you by your health care provider. Make sure you discuss any questions you have with your health care provider. Document Released: 06/15/2016 Document Revised: 06/15/2016 Document Reviewed: 06/15/2016 Elsevier Interactive Patient Education  2018 [place] Disease Caregiver Guide A person who has Alzheimer disease may not be able to take care of himself or herself. He or she may need help with simple tasks. The tips below can help you care for the person. Memory loss and confusion If the person is confused or cannot remember things:  Stay calm.  Respond with a short answer.  Avoid correcting him or her in a way that sounds like scolding.  Try not to take it personally, even if he or she forgets your name.  Behavior changes The person may go through behavior changes. This can include depression, anxiety, anger, or seeing things that are not there. When behavior changes:  Try not to take  behavior changes personally.  Stay calm and patient.  Do not argue or try to convince the person about a specific point.  Know that these changes are part of the disease process. Try to work through it.  Tips to lessen frustration  Make appointments and do daily tasks when the person is at his or her best.  Take your time. Simple tasks may take longer. Allow plenty of time to complete tasks.  Limit choices for the person.  Involve the person in what you are doing.  Stick to a routine.  Avoid new or crowded places, if possible.  Use simple words, short sentences, and a calm voice. Only give 1 direction at a time.  Buy clothes and shoes that are easy to put on and take off.  Let people help if they offer. Home safety  Keep floors  clear. Remove rugs, magazine racks, and floor lamps.  Keep hallways well lit.  Put a handrail and nonslip mat in the bathtub or shower.  Put childproof locks on cabinets that have dangerous items in them. These items include medicine, alcohol, guns, toxic cleaning items, sharp tools, matches, or lighters.  Place locks on doors where the person cannot see or reach them. This helps the person to not wander out of the house and get lost.  Be prepared for emergencies. Keep a list of emergency phone numbers and addresses in a handy area. Plans for the future  Talk about finances. ? Talk about money management. People with Alzheimer disease have trouble managing their money as the disease gets worse. ? Get help from professional advisors about financial and legal matters.  Talk about future care. ? Choose a power of attorney. This is someone who can make decisions for the person with Alzheimer disease when he or she can no longer do so. ? Talk about driving and when it is the right time to stop. The person's doctor can help with this. ? If the person lives alone, make sure he or she is safe. Some people need extra help at home. Other people need more care at a nursing home or care center. Support groups Some benefits of joining a support group include:  Learning ways to manage stress.  Sharing experiences with others.  Getting emotional comfort and support.  Learning new caregiving skills as the disease progresses.  Knowing what community resources are available and taking advantage of them.  Get help if:  The person has a fever.  The person has a sudden behavior change that does not get better with calming strategies.  The person is unable to manage his or her living situation.  The person threatens you or anyone else, including himself or herself.  You are no longer able to care for the person. This information is not intended to replace advice given to you by your health  care provider. Make sure you discuss any questions you have with your health care provider. Document Released: 05/04/2011 Document Revised: 07/18/2015 Document Reviewed: 04/01/2011 Elsevier Interactive Patient Education  2017 [employer].

## 2017-08-19 NOTE — Progress Notes (Signed)
By signing my name below, I, Chad Juarez, attest that this documentation has been prepared under the direction and in the presence of Delman Cheadle, MD. Electronically Signed: Moises Juarez, [place]. 08/19/2017 , 10:08 AM .  Patient was seen in Room 2 .  [facility] [place]  Subjective: Chad Juarez is a 67 y.o. male who presents for a Welcome to Medicare exam.   He mentions noticing slight hematuria for the past few months with pain starting urine. After he starts his flow, the pain subsides. He noticed Juarez in his semen in February.   Health maintenance Colonoscopy: last colonoscopy incomplete done 12/12/09; followed by [facility], repeat in 2021. Initial was done at [facility], and his records have been transferred. Has a history of sigmoid diverticulosis, abnormal sigmoid findings with unexplained Juarez and cramping, so a contrast barium enema showed redundant colon with a giant 11 cm diverticulum that has superposed small diverticula off of that. He's eating mostly high fiber diet, and rarely eating meats now. He's noticed occasional bloating lower abdomen but without pain.   Skin cancer screening: followed by Dr. Denna Haggard.   Immunizations: Shingrix, prescription sent to pharmacy.   Vision: wears glasses, annual eye exams.   Cardiac: sees Dr. Tamala Julian for cardiology visits, last office visit 04/2016. POET 05/2016 and Holter monitor both normal. Chest xray 2012 was normal.   Exercise: he walks regularly. He's also been trying to lift weights every day. He mows his grass as well.   Stressors: His mother is under hospice care in a nursing home in [place]. She was seen at ER for aspirated pneumonia with Alzheimer's (diagnosed 20 years ago). She also has history of pulmonary HTN, afib and multiple medical history. She just turned 66 years old.   Smoking history: has smoked more than 100 cigarettes in the past.    Review of Systems: Positive for difficulty urinating, and pain  with urination.     Objective:  Today's Vitals ---------------------------              08/19/17                     0846        ---------------------------  BP:          122/70        Pulse:         68          Resp:          16          Temp:   97.6 F (36.4 C)  TempSrc:      Oral         SpO2:          95%         Weight: 142 lb (64.4 kg)   Height: 5' 9.69" (1.77 m) --------------------------- Body mass index is 20.56 kg/m.  Medications Outpatient Encounter Medications as of 08/19/2017: aspirin 81 MG tablet, Take 81 mg by mouth daily. Reported on 08/01/2015 fluticasone (FLONASE) 50 MCG/ACT nasal spray, Place 2 sprays into both nostrils daily as needed. Multiple Vitamin (MULTIVITAMIN) tablet, Take 1 tablet by mouth daily. OVER THE COUNTER MEDICATION, Famotidine (Pepcid) 20 mg prn rosuvastatin (CRESTOR) 5 MG tablet, Take 1 tablet (5 mg total) by mouth daily.  No facility-administered encounter medications on file as of 08/19/2017.     History: Past Medical History: No date: Allergy 11/2008: Basal cell carcinoma No date: Depression 10/2004:  Diverticulosis 07/2001: GERD (gastroesophageal reflux disease) 09/2004: GI bleeding 2004: History of Helicobacter pylori infection 04/1989: History of kidney stones 12/2002: HLD (hyperlipidemia) No date: Ventral hernia Past Surgical History: 02/05/2010: COLECTOMY No date: KIDNEY STONE SURGERY No date: TONSILLECTOMY  Review of patient's family history indicates: Problem: Colon polyps     Relation: Father         Age of Onset: (Not Specified) Problem: Hypertension     Relation: Father         Age of Onset: (Not Specified) Problem: Heart disease     Relation: Father         Age of Onset: (Not Specified) Problem: Hyperlipidemia     Relation: Father         Age of Onset: (Not Specified) Problem: Clotting disorder     Relation: Mother         Age of Onset: (Not Specified)         Comment:  pulmonary embolism Problem: Heart disease     Relation: Mother         Age of Onset: (Not Specified) Problem: Hyperlipidemia     Relation: Mother         Age of Onset: (Not Specified) Problem: Hypertension     Relation: Mother         Age of Onset: (Not Specified) Problem: Mental illness     Relation: Mother         Age of Onset: (Not Specified) Problem: Hodgkin's lymphoma     Relation: Other         Age of Onset: (Not Specified)         Comment: nephew Problem: Hyperlipidemia     Relation: Brother         Age of Onset: (Not Specified) Problem: Hyperlipidemia     Relation: Sister         Age of Onset: (Not Specified) Problem: Heart disease     Relation: Sister         Age of Onset: (Not Specified) Problem: Diabetes     Relation: Paternal Grandfather         Age of Onset: (Not Specified) Problem: Heart disease     Relation: Paternal Grandfather         Age of Onset: (Not Specified) Problem: Hyperlipidemia     Relation: Paternal Grandfather         Age of Onset: (Not Specified) Problem: Hypertension     Relation: Paternal Grandfather         Age of Onset: (Not Specified) Problem: Stroke     Relation: Paternal Grandfather         Age of Onset: (Not Specified) Problem: Heart disease     Relation: Maternal Grandmother         Age of Onset: (Not Specified) Problem: Hyperlipidemia     Relation: Maternal Grandmother         Age of Onset: (Not Specified) Problem: Heart disease     Relation: Maternal Grandfather         Age of Onset: (Not Specified) Problem: Hyperlipidemia     Relation: Maternal Grandfather         Age of Onset: (Not Specified) Problem: Hypertension     Relation: Maternal Grandfather         Age of Onset: (Not Specified) Problem: Heart disease     Relation: Paternal Grandmother         Age of Onset: (Not Specified) Problem: Hyperlipidemia  Relation: Paternal Grandmother         Age of Onset: (Not Specified)  Social History   Occupational  History     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   Tobacco Use     Smoking status: Former Smoker       Quit date: 08/13/2001       Years since quitting: 16.0     Smokeless tobacco: Never Used   Substance and Sexual Activity     Alcohol use: Yes       Alcohol/week: 7.2 - 8.4 oz       Types: 12 - 14 Standard drinks or equivalent per week       Comment: wine with dinner 10-14 drinks      Drug use: No     Sexual activity: Not on file  Tobacco Counseling Counseling given: Not Answered   Immunizations and Health Maintenance  Immunization History Administered            Date(s) Administered   Influenza Split       12/22/2011     Influenza,inj,Quad PF,6+ Mos                         12/19/2012  12/21/2013  12/14/2014                           12/20/2015  12/18/2016     Pneumococcal-Unspecified                         06/23/2008     Td                    06/23/2008     Tdap                  08/13/2016   PNA vac Low Risk Adult(1 of 2 - PCV13) due on 12/21/2016  Activities of Daily Living  Physical Exam  HENT: ear canals with cerumen impaction.  Cardio: heart regular rate and rhythm, normal s1 and s2, no heart murrmurs GU: 5 mm nodule located 10 o'clock prostate, non tender, prostate is firm  Advanced Directives: Does Patient Have a Medical Advance Directive?: Yes Type of Advance Directive: Living will, Healthcare Power of Attorney Does patient want to make changes to medical advance directive?: No - Patient declined Copy of [place] in Chart?: No - copy requested   Assessment:  This is a routine wellness  examination for this patient .   Vision/Hearing screen Visual Acuity in Right Eye - Without correction:   With correction: 20/20 Visual Acuity in Left Eye - Without correction:   With correction: 20/20 Visual Acuity in Both Eyes - Without correction:   With correction: 20/15  Dietary issues and exercise activities discussed:      Goals    None     Depression Screen  Fall Risk  Cognitive Function: he would go into preventative Alzheimer's activities, but he hasn't had the time for it recently. He's been reading a lot more recently.     Patient Care Team: Shawnee Knapp, MD as PCP - General (Family Medicine) Ralene Bathe, MD as Consulting Physician (Ophthalmology) Lavonna Monarch, MD as Consulting Physician (Dermatology)  Welcome to Medicare preventive visit  (primary encounter diagnosis) Hyperlipidemia with target LDL less than 100 Screening for prostate cancer Other microscopic hematuria Nodular  prostate with lower urinary tract symptoms Screening for AAA (abdominal aortic aneurysm) Personal history of tobacco use Bilateral impacted cerumen Seasonal allergic rhinitis, unspecified trigger Pure hypercholesterolemia   Plan: Orders Placed This Encounter     Urine Culture     Comprehensive metabolic panel         Order Specific Question: Has the patient fasted?         Answer: No     Lipid panel         Order Specific Question: Has the patient fasted?         Answer: No     PSA Total (Reflex To Free)     Urinalysis, microscopic only     Ambulatory referral to Urology         Referral Priority:Routine         Referral Type:Consultation         Referral Reason:Specialty Services Required         Referred to Provider:Dahlstedt, Annie Main, MD         Requested Specialty:Urology         Number of Visits Requested:1     POCT urinalysis dipstick     EKG 12-Lead   Orders Placed This Encounter     Zoster Vaccine Adjuvanted [facility]) injection         Sig: Inject 0.5 mLs into the muscle once for 1 dose. Repeat once in 2 to 6 months         Dispense:  0.5 mL         Refill:  1     fluticasone (FLONASE) 50 MCG/ACT nasal spray         Sig: Place 2 sprays into both nostrils daily as needed.         Dispense:  16 g         Refill:  11     rosuvastatin (CRESTOR) 5 MG tablet         Sig: Take 1 tablet  (5 mg total) by mouth daily.         Dispense:  90 tablet         Refill:  3   I have personally reviewed and noted the following in the patient's chart:   Medical and social history Use of alcohol, tobacco or illicit drugs  Current medications and supplements Functional ability and status Nutritional status Physical activity Advanced directives List of other physicians Hospitalizations, surgeries, and ER visits in previous 12 months Vitals Screenings to include cognitive, depression, and falls Referrals and appointments  In addition, I have reviewed and discussed with patient certain preventive protocols, quality metrics, and best practice recommendations. A written personalized care plan for preventive services as well as general preventive health recommendations were provided to patient.     Delman Cheadle, MD, MPH Primary Care at [place], [place] [address] [phone] Office phone  [phone] Office fax   12/05/17 4:59 PM

## 2017-08-20 LAB — COMPREHENSIVE METABOLIC PANEL
A/G RATIO: 2.6 — AB (ref 1.2–2.2)
ALT: 19 IU/L (ref 0–44)
AST: 19 IU/L (ref 0–40)
Albumin: 4.6 g/dL (ref 3.6–4.8)
Alkaline Phosphatase: 74 IU/L (ref 39–117)
BUN / CREAT RATIO: 15 (ref 10–24)
BUN: 12 mg/dL (ref 8–27)
Bilirubin Total: 0.6 mg/dL (ref 0.0–1.2)
CALCIUM: 9.8 mg/dL (ref 8.6–10.2)
CO2: 25 mmol/L (ref 20–29)
CREATININE: 0.78 mg/dL (ref 0.76–1.27)
Chloride: 101 mmol/L (ref 96–106)
GFR calc Af Amer: 110 mL/min/{1.73_m2} (ref >59)
GFR calc non Af Amer: 95 mL/min/{1.73_m2} (ref >59)
GLOBULIN, TOTAL: 1.8 g/dL (ref 1.5–4.5)
Glucose: 98 mg/dL (ref 65–99)
Potassium: 4.6 mmol/L (ref 3.5–5.2)
SODIUM: 140 mmol/L (ref 134–144)
TOTAL PROTEIN: 6.4 g/dL (ref 6.0–8.5)

## 2017-08-20 LAB — URINALYSIS, MICROSCOPIC ONLY
CASTS: NONE SEEN /LPF
EPITHELIAL CELLS (NON RENAL): NONE SEEN /HPF (ref 0–10)

## 2017-08-20 LAB — PSA TOTAL (REFLEX TO FREE): Prostate Specific Ag, Serum: 2.3 ng/mL (ref 0.0–4.0)

## 2017-08-20 LAB — LIPID PANEL
CHOLESTEROL TOTAL: 138 mg/dL (ref 100–199)
Chol/HDL Ratio: 2.3 ratio (ref 0.0–5.0)
HDL: 61 mg/dL (ref >39)
LDL Calculated: 68 mg/dL (ref 0–99)
Triglycerides: 44 mg/dL (ref 0–149)
VLDL Cholesterol Cal: 9 mg/dL (ref 5–40)

## 2017-08-20 LAB — URINE CULTURE

## 2017-08-25 ENCOUNTER — Ambulatory Visit (HOSPITAL_COMMUNITY)
Admission: RE | Admit: 2017-08-25 | Discharge: 2017-08-25 | Disposition: A | Discharge status: Home / Self Care | Payer: Medicare Other | Source: Ambulatory Visit | Attending: Vascular Surgery | Admitting: Vascular Surgery

## 2017-08-25 DIAGNOSIS — Z136 Encounter for screening for cardiovascular disorders: Secondary | ICD-10-CM | POA: Insufficient documentation

## 2017-08-25 DIAGNOSIS — I700 Atherosclerosis of aorta: Secondary | ICD-10-CM | POA: Diagnosis not present

## 2017-08-25 DIAGNOSIS — Z87891 Personal history of nicotine dependence: Secondary | ICD-10-CM | POA: Diagnosis not present

## 2017-09-09 ENCOUNTER — Telehealth: Payer: Self-pay | Admitting: Family Medicine

## 2017-09-09 NOTE — Telephone Encounter (Signed)
Copied from [place] [phone]. Topic: Referral - Status >> Sep 09, 2017 10:34 AM Synthia Innocent wrote: Reason for CRM: Requesting status of referral to [facility]

## 2017-09-16 NOTE — Telephone Encounter (Signed)
Left message in voice mail about referral to [facility]. I checked with referral and Dr Brigitte Pulse has not completed office notes. Once notes are complete they will be faxed [facility]. Referral routed message to Dr Brigitte Pulse.

## 2017-09-16 NOTE — Telephone Encounter (Signed)
Please advise. Can we get notes closed to send referral? Thanks!

## 2017-09-24 ENCOUNTER — Encounter: Payer: Self-pay | Admitting: Family Medicine

## 2018-08-22 ENCOUNTER — Encounter: Payer: Self-pay | Admitting: Registered Nurse

## 2018-08-22 ENCOUNTER — Other Ambulatory Visit: Payer: Self-pay

## 2018-08-22 ENCOUNTER — Ambulatory Visit (INDEPENDENT_AMBULATORY_CARE_PROVIDER_SITE_OTHER): Payer: Medicare Other | Admitting: Registered Nurse

## 2018-08-22 VITALS — BP 120/70 | HR 90 | Temp 98.3°F | Resp 16 | Ht 69.69 in | Wt 141.0 lb

## 2018-08-22 DIAGNOSIS — E785 Hyperlipidemia, unspecified: Secondary | ICD-10-CM | POA: Diagnosis not present

## 2018-08-22 DIAGNOSIS — Z8719 Personal history of other diseases of the digestive system: Secondary | ICD-10-CM

## 2018-08-22 DIAGNOSIS — Z13228 Encounter for screening for other metabolic disorders: Secondary | ICD-10-CM

## 2018-08-22 DIAGNOSIS — Z23 Encounter for immunization: Secondary | ICD-10-CM

## 2018-08-22 DIAGNOSIS — Z130 Encounter for screening for diseases of the blood and blood-forming organs and certain disorders involving the immune mechanism: Secondary | ICD-10-CM

## 2018-08-22 DIAGNOSIS — Z1329 Encounter for screening for other suspected endocrine disorder: Secondary | ICD-10-CM | POA: Diagnosis not present

## 2018-08-22 NOTE — Patient Instructions (Signed)
° ° ° °  If you have lab work done today you will be contacted with your lab results within the next 2 weeks.  If you have not heard from us then please contact us. The fastest way to get your results is to register for My Chart. ° ° °IF you received an x-ray today, you will receive an invoice from [facility]. Please contact [facility] at [phone] with questions or concerns regarding your invoice.  ° °IF you received labwork today, you will receive an invoice from [facility]. Please contact [facility] at [phone] with questions or concerns regarding your invoice.  ° °Our billing staff will not be able to assist you with questions regarding bills from these companies. ° °You will be contacted with the lab results as soon as they are available. The fastest way to get your results is to activate your My Chart account. Instructions are located on the last page of this paperwork. If you have not heard from us regarding the results in 2 weeks, please contact this office. °  ° ° ° °

## 2018-08-22 NOTE — Progress Notes (Signed)
Established Patient Office Visit  Subjective:  Patient ID: Chad Juarez, male    DOB: October 27, 1950  Age: 68 y.o. MRN: [id number]  CC:  Chief Complaint  Patient presents with  . Medicare Wellness    HPI Chad Juarez is a very pleasant 68 y.o. male presenting today for a visit to establish care and CPE.  His last visit was around 1 year prior in June 2019. He is a former patient of Dr. Brigitte Juarez and Dr. Laney Juarez.   He has a medical history as listed below. Of note:  Diverticulitis: followed by GI. He has an 11cm pocket - likely congenital. He is next due for colonoscopy in 2021. He reports no recent bowel changes. He is still largely on a vegetarian diet with occasional chicken or seafood.  HLD: On Rosuvastatin 5mg  PO qd with good effect. His lipids have consistently been under control on this dose. He has a strong family history of CV illnesses - as such, we will keep him on the Rosuvastatin and monitor his LFTs.  PVCs: Pt has been seen and worked up by Cardiology, last seen in 2018. He was discharged from their practice and asked to follow up if he became symptomatic. He reports that he is exercising regularly and has not felt any symptoms, though he still notices an occasional PVC.   Of note in his social history, his mother unfortunately passed away in 12-07-2017. He was her primary caregiver, as his father, who is age 63, is unfortunately in decline himself. She struggled with Chad Juarez for 20 years prior to passing. Chad Juarez is worried that he is experiencing the onset of symptoms himself, citing losing household items like his glasses or word finding difficulties. He states that overall he remains active, exercising and engaging his mind regularly, and it is clear that he is very competent. We will continue to monitor his symptoms and perform Mini-Cog, MMSE, and refer to neuro as necessary.  Past Medical History:  Diagnosis Date  . Allergy   . Basal cell carcinoma  11/2008  . Depression   . Diverticulosis December 07, 2004  . GERD (gastroesophageal reflux Juarez) 07/2001  . GI bleeding 09/2004  . History of Helicobacter pylori infection 2004  . History of kidney stones 04/1989  . HLD (hyperlipidemia) 12/2002  . Ventral hernia     Past Surgical History:  Procedure Laterality Date  . COLECTOMY  02/05/2010  . KIDNEY STONE SURGERY    . TONSILLECTOMY      Family History  Problem Relation Age of Onset  . Colon polyps Father   . Hypertension Father   . Heart Juarez Father   . Hyperlipidemia Father   . Clotting disorder Mother        pulmonary embolism  . Heart Juarez Mother   . Hyperlipidemia Mother   . Hypertension Mother   . Mental illness Mother   . Hodgkin's lymphoma Other        nephew  . Hyperlipidemia Brother   . Hyperlipidemia Sister   . Heart Juarez Sister   . Diabetes Paternal Grandfather   . Heart Juarez Paternal Grandfather   . Hyperlipidemia Paternal Grandfather   . Hypertension Paternal Grandfather   . Stroke Paternal Grandfather   . Heart Juarez Maternal Grandmother   . Hyperlipidemia Maternal Grandmother   . Heart Juarez Maternal Grandfather   . Hyperlipidemia Maternal Grandfather   . Hypertension Maternal Grandfather   . Heart Juarez Paternal Grandmother   . Hyperlipidemia Paternal  Grandmother     Social History   Socioeconomic History  . Marital status: Single    Spouse name: Not on file  . Number of children: 0  . Years of education: Not on file  . Highest education level: Not on file  Occupational History  . Occupation: retired Marine scientist: RETIRED  Social Needs  . Financial resource strain: Not hard at all  . Food insecurity    Worry: Never true    Inability: Never true  . Transportation needs    Medical: No    Non-medical: No  Tobacco Use  . Smoking status: Former Smoker    Packs/day: 1.50    Years: 20.00    Pack years: 30.00    Types: Cigarettes    Quit date: 08/13/2000    Years  since quitting: 18.0  . Smokeless tobacco: Never Used  Substance and Sexual Activity  . Alcohol use: Yes    Alcohol/week: 12.0 - 14.0 standard drinks    Types: 12 - 14 Standard drinks or equivalent per week    Comment: wine with dinner 10-14 drinks   . Drug use: No  . Sexual activity: Not Currently  Lifestyle  . Physical activity    Days per week: 5 days    Minutes per session: 60 min  . Stress: Only a little  Relationships  . Social Herbalist on phone: Three times a week    Gets together: Twice a week    Attends religious service: Patient refused    Active member of club or organization: Patient refused    Attends meetings of clubs or organizations: Patient refused    Relationship status: Never married  . Intimate partner violence    Fear of current or ex partner: No    Emotionally abused: No    Physically abused: No    Forced sexual activity: No  Other Topics Concern  . Not on file  Social History Narrative   Single. Education:    Exercise walking 2-4 miles/week    Outpatient Medications Prior to Visit  Medication Sig Dispense Refill  . aspirin 81 MG tablet Take 81 mg by mouth daily. Reported on 08/01/2015    . fluticasone (FLONASE) 50 MCG/ACT nasal spray Place 2 sprays into both nostrils daily as needed. 16 g 11  . Multiple Vitamin (MULTIVITAMIN) tablet Take 1 tablet by mouth daily.    . rosuvastatin (CRESTOR) 5 MG tablet Take 1 tablet (5 mg total) by mouth daily. 90 tablet 3  . OVER THE COUNTER MEDICATION Famotidine (Pepcid) 20 mg prn    . rosuvastatin (CRESTOR) 5 MG tablet      No facility-administered medications prior to visit.     No Known Allergies  ROS Review of Systems  Constitutional: Negative.   HENT: Negative.   Eyes: Negative.   Respiratory: Negative.   Cardiovascular: Negative.   Gastrointestinal: Negative.   Endocrine: Negative.   Genitourinary: Negative.   Musculoskeletal: Negative.   Skin: Negative.   Allergic/Immunologic:  Negative.   Neurological: Negative.   Hematological: Negative.   Psychiatric/Behavioral: Negative.   All other systems reviewed and are negative.     Objective:    Physical Exam  Constitutional: He is oriented to person, place, and time. He appears well-developed and well-nourished. No distress.  HENT:  Head: Normocephalic.  Right Ear: External ear normal.  Left Ear: External ear normal.  Nose: Nose normal.  Mouth/Throat: Oropharynx is clear and moist. No oropharyngeal exudate.  Impacted cerumen bilaterally. Denies decrease in hearing.   Eyes: Pupils are equal, round, and reactive to light. Conjunctivae and EOM are normal. Right eye exhibits no discharge. Left eye exhibits no discharge. No scleral icterus.  Neck: Normal range of motion. No tracheal deviation present.  Cardiovascular: Normal rate, regular rhythm, normal heart sounds and intact distal pulses. Exam reveals no gallop and no friction rub.  No murmur heard. Pulmonary/Chest: Effort normal and breath sounds normal. No respiratory distress. He has no wheezes. He has no rales. He exhibits no tenderness.  Abdominal: Soft. Bowel sounds are normal.  Musculoskeletal: Normal range of motion.        General: No edema.  Lymphadenopathy:    He has no cervical adenopathy.  Neurological: He is alert and oriented to person, place, and time. No cranial nerve deficit.  Skin: Skin is warm. No rash noted. He is not diaphoretic. No erythema. No pallor.  Psychiatric: He has a normal mood and affect. His behavior is normal. Judgment and thought content normal.  Nursing note and vitals reviewed.   BP 120/70   Juarez 90   Temp 98.3 F (36.8 C) (Oral)   Resp 16   Ht 5' 9.69" (1.77 m)   Wt 141 lb (64 kg)   SpO2 95%   BMI 20.41 kg/m  Wt Readings from Last 3 Encounters:  08/22/18 141 lb (64 kg)  08/19/17 142 lb (64.4 kg)  08/13/16 139 lb 3.2 oz (63.1 kg)     Health Maintenance Due  Topic Date Due  . PNA vac Low Risk Adult (1 of 2  - PCV13) 12/21/2016    There are no preventive care reminders to display for this patient.  Lab Results  Component Value Date   TSH 1.580 04/16/2016   Lab Results  Component Value Date   WBC 7.4 04/16/2016   HGB 14.8 04/16/2016   HCT 45.2 04/16/2016   MCV 89 04/16/2016   PLT 209 04/16/2016   Lab Results  Component Value Date   NA 140 08/19/2017   K 4.6 08/19/2017   CO2 25 08/19/2017   GLUCOSE 98 08/19/2017   BUN 12 08/19/2017   CREATININE 0.78 08/19/2017   BILITOT 0.6 08/19/2017   ALKPHOS 74 08/19/2017   AST 19 08/19/2017   ALT 19 08/19/2017   PROT 6.4 08/19/2017   ALBUMIN 4.6 08/19/2017   CALCIUM 9.8 08/19/2017   Lab Results  Component Value Date   CHOL 138 08/19/2017   Lab Results  Component Value Date   HDL 61 08/19/2017   Lab Results  Component Value Date   LDLCALC 68 08/19/2017   Lab Results  Component Value Date   TRIG 44 08/19/2017   Lab Results  Component Value Date   CHOLHDL 2.3 08/19/2017   No results found for: HGBA1C    Assessment & Plan:   Problem List Items Addressed This Visit      Other   Hyperlipidemia with target LDL less than 100 - Primary   Relevant Orders   Lipid Panel    Other Visit Diagnoses    Screening for endocrine, metabolic and immunity disorder       Relevant Orders   Comprehensive metabolic panel   Pneumococcal vaccination administered at current visit       Relevant Orders   Pneumococcal conjugate vaccine 13-valent IM   History of diverticulosis       Relevant Orders   CBC      No orders of the defined types were  placed in this encounter.   Follow-up: No follow-ups on file.   PLAN:  Labs ordered: CBC, CMP, Lipids. Will follow up as warranted.  PSA wnl last year - pt follows with urology.   Bilat impacted cerumen: Pt to try mineral oil at home and massaging wax out himself - otherwise, will return for disimpaction.  First dose of Pneumovax given today. Pt will return for second dose.  Otherwise,  patient appears very healthy. No major concerns at this time.   Patient encouraged to call clinic with any questions, comments, or concerns.    Maximiano Coss, NP

## 2018-08-23 LAB — CBC
Hematocrit: 43.5 % (ref 37.5–51.0)
Hemoglobin: 14.7 g/dL (ref 13.0–17.7)
MCH: 29.9 pg (ref 26.6–33.0)
MCHC: 33.8 g/dL (ref 31.5–35.7)
MCV: 89 fL (ref 79–97)
Platelets: 199 10*3/uL (ref 150–450)
RBC: 4.91 x10E6/uL (ref 4.14–5.80)
RDW: 12.2 % (ref 11.6–15.4)
WBC: 5.9 10*3/uL (ref 3.4–10.8)

## 2018-08-23 LAB — LIPID PANEL
Chol/HDL Ratio: 2.4 ratio (ref 0.0–5.0)
Cholesterol, Total: 150 mg/dL (ref 100–199)
HDL: 62 mg/dL (ref >39)
LDL Calculated: 76 mg/dL (ref 0–99)
Triglycerides: 59 mg/dL (ref 0–149)
VLDL Cholesterol Cal: 12 mg/dL (ref 5–40)

## 2018-08-23 LAB — COMPREHENSIVE METABOLIC PANEL
ALT: 17 IU/L (ref 0–44)
AST: 19 IU/L (ref 0–40)
Albumin/Globulin Ratio: 2.9 — ABNORMAL HIGH (ref 1.2–2.2)
Albumin: 5 g/dL — ABNORMAL HIGH (ref 3.8–4.8)
Alkaline Phosphatase: 69 IU/L (ref 39–117)
BUN/Creatinine Ratio: 14 (ref 10–24)
BUN: 11 mg/dL (ref 8–27)
Bilirubin Total: 0.6 mg/dL (ref 0.0–1.2)
CO2: 23 mmol/L (ref 20–29)
Calcium: 10.1 mg/dL (ref 8.6–10.2)
Chloride: 100 mmol/L (ref 96–106)
Creatinine, Ser: 0.77 mg/dL (ref 0.76–1.27)
GFR calc Af Amer: 109 mL/min/{1.73_m2} (ref >59)
GFR calc non Af Amer: 95 mL/min/{1.73_m2} (ref >59)
Globulin, Total: 1.7 g/dL (ref 1.5–4.5)
Glucose: 94 mg/dL (ref 65–99)
Potassium: 4.5 mmol/L (ref 3.5–5.2)
Sodium: 137 mmol/L (ref 134–144)
Total Protein: 6.7 g/dL (ref 6.0–8.5)

## 2018-08-23 NOTE — Progress Notes (Signed)
Good morning Mr Deyton, Your lab results have returned. We can see a mild elevation in your albumin on your CMP. I believe this to be benign, given that it is an isolated and very mild abnormality. The rest of your labs returned normal.  If you have any questions, feel free to give me a call. Have a great day.  Kathrin Ruddy, NP

## 2018-08-25 ENCOUNTER — Encounter: Payer: Self-pay | Admitting: Registered Nurse

## 2018-08-25 ENCOUNTER — Other Ambulatory Visit: Payer: Self-pay | Admitting: Registered Nurse

## 2018-08-25 DIAGNOSIS — E7800 Pure hypercholesterolemia, unspecified: Secondary | ICD-10-CM

## 2018-08-25 DIAGNOSIS — J302 Other seasonal allergic rhinitis: Secondary | ICD-10-CM

## 2018-08-25 MED ORDER — ROSUVASTATIN CALCIUM 5 MG PO TABS: 5.0000 mg | ORAL_TABLET | Freq: Every day | ORAL | 3 refills | Status: DC

## 2018-08-25 MED ORDER — FLUTICASONE PROPIONATE 50 MCG/ACT NA SUSP: 2.0000 | Freq: Every day | NASAL | 11 refills | Status: DC | PRN

## 2018-08-25 NOTE — Progress Notes (Signed)
Refills sent to pharmacy  Kathrin Ruddy, NP

## 2018-09-06 NOTE — Addendum Note (Signed)
Addended by: Maximiano Coss on: 09/06/2018 03:55 PM   Modules accepted: Level of Service

## 2019-03-15 ENCOUNTER — Ambulatory Visit: Payer: Medicare Other | Attending: Internal Medicine

## 2019-03-15 DIAGNOSIS — Z23 Encounter for immunization: Secondary | ICD-10-CM

## 2019-03-15 NOTE — Progress Notes (Signed)
   Covid-19 Vaccination Clinic  Name:  PALADIN VEILLETTE    MRN: [id number] DOB: [birth year]  03/15/2019  Mr. Hain was observed post Covid-19 immunization for 15 minutes without incidence. He was provided with Vaccine Information Sheet and instruction to access the V-Safe system.   Mr. Manne was instructed to call 911 with any severe reactions post vaccine: Marland Kitchen Difficulty breathing  . Swelling of your face and throat  . A fast heartbeat  . A bad rash all over your body  . Dizziness and weakness    Immunizations Administered    Name Date Dose VIS Date Route   Pfizer COVID-19 Vaccine 03/15/2019  6:35 PM 0.3 mL 02/03/2019 Intramuscular   Manufacturer: [place]   Lot: [id number]   [place]: [id number]

## 2019-04-02 ENCOUNTER — Ambulatory Visit: Payer: Medicare Other | Attending: Internal Medicine

## 2019-04-02 DIAGNOSIS — Z23 Encounter for immunization: Secondary | ICD-10-CM | POA: Insufficient documentation

## 2019-04-02 NOTE — Progress Notes (Signed)
   Covid-19 Vaccination Clinic  Name:  Chad Juarez    MRN: [id number] DOB: [birth year]  04/02/2019  [name] was observed post Covid-19 immunization for 15 minutes without incidence. He was provided with Vaccine Information Sheet and instruction to access the V-Safe system.   [name] was instructed to call 911 with any severe reactions post vaccine: Marland Kitchen Difficulty breathing  . Swelling of your face and throat  . A fast heartbeat  . A bad rash all over your body  . Dizziness and weakness    Immunizations Administered    Name Date Dose VIS Date Route   Pfizer COVID-19 Vaccine 04/02/2019  3:27 PM 0.3 mL 02/03/2019 Intramuscular   Manufacturer: [place]   Lot: [id number]   [place]: [id number]

## 2019-08-16 ENCOUNTER — Other Ambulatory Visit: Payer: Self-pay | Admitting: Registered Nurse

## 2019-08-16 DIAGNOSIS — E7800 Pure hypercholesterolemia, unspecified: Secondary | ICD-10-CM

## 2019-08-23 ENCOUNTER — Ambulatory Visit (INDEPENDENT_AMBULATORY_CARE_PROVIDER_SITE_OTHER): Payer: Medicare Other | Admitting: Registered Nurse

## 2019-08-23 ENCOUNTER — Other Ambulatory Visit: Payer: Self-pay

## 2019-08-23 ENCOUNTER — Encounter: Payer: Self-pay | Admitting: Registered Nurse

## 2019-08-23 VITALS — BP 128/68 | HR 83 | Temp 97.7°F | Resp 18 | Ht 69.5 in | Wt 143.6 lb

## 2019-08-23 DIAGNOSIS — Z8719 Personal history of other diseases of the digestive system: Secondary | ICD-10-CM

## 2019-08-23 DIAGNOSIS — Z0001 Encounter for general adult medical examination with abnormal findings: Secondary | ICD-10-CM

## 2019-08-23 DIAGNOSIS — I499 Cardiac arrhythmia, unspecified: Secondary | ICD-10-CM | POA: Diagnosis not present

## 2019-08-23 DIAGNOSIS — Z0000 Encounter for general adult medical examination without abnormal findings: Secondary | ICD-10-CM

## 2019-08-23 DIAGNOSIS — E785 Hyperlipidemia, unspecified: Secondary | ICD-10-CM | POA: Diagnosis not present

## 2019-08-23 LAB — CBC
Hematocrit: 44.8 % (ref 37.5–51.0)
Hemoglobin: 15 g/dL (ref 13.0–17.7)
MCH: 30.1 pg (ref 26.6–33.0)
MCHC: 33.5 g/dL (ref 31.5–35.7)
MCV: 90 fL (ref 79–97)
Platelets: 185 10*3/uL (ref 150–450)
RBC: 4.98 x10E6/uL (ref 4.14–5.80)
RDW: 12.3 % (ref 11.6–15.4)
WBC: 5.3 10*3/uL (ref 3.4–10.8)

## 2019-08-23 LAB — COMPREHENSIVE METABOLIC PANEL
ALT: 15 IU/L (ref 0–44)
AST: 18 IU/L (ref 0–40)
Albumin/Globulin Ratio: 3.1 — ABNORMAL HIGH (ref 1.2–2.2)
Albumin: 5.2 g/dL — ABNORMAL HIGH (ref 3.8–4.8)
Alkaline Phosphatase: 77 IU/L (ref 48–121)
BUN/Creatinine Ratio: 22 (ref 10–24)
BUN: 19 mg/dL (ref 8–27)
Bilirubin Total: 0.8 mg/dL (ref 0.0–1.2)
CO2: 23 mmol/L (ref 20–29)
Calcium: 10 mg/dL (ref 8.6–10.2)
Chloride: 101 mmol/L (ref 96–106)
Creatinine, Ser: 0.86 mg/dL (ref 0.76–1.27)
GFR calc Af Amer: 104 mL/min/{1.73_m2} (ref >59)
GFR calc non Af Amer: 90 mL/min/{1.73_m2} (ref >59)
Globulin, Total: 1.7 g/dL (ref 1.5–4.5)
Glucose: 85 mg/dL (ref 65–99)
Potassium: 4.5 mmol/L (ref 3.5–5.2)
Sodium: 141 mmol/L (ref 134–144)
Total Protein: 6.9 g/dL (ref 6.0–8.5)

## 2019-08-23 LAB — LIPID PANEL
Chol/HDL Ratio: 2.5 ratio (ref 0.0–5.0)
Cholesterol, Total: 150 mg/dL (ref 100–199)
HDL: 61 mg/dL (ref >39)
LDL Chol Calc (NIH): 79 mg/dL (ref 0–99)
Triglycerides: 47 mg/dL (ref 0–149)
VLDL Cholesterol Cal: 10 mg/dL (ref 5–40)

## 2019-08-23 NOTE — Patient Instructions (Signed)
° ° ° °  If you have lab work done today you will be contacted with your lab results within the next 2 weeks.  If you have not heard from us then please contact us. The fastest way to get your results is to register for My Chart. ° ° °IF you received an x-ray today, you will receive an invoice from [facility]. Please contact [facility] at [phone] with questions or concerns regarding your invoice.  ° °IF you received labwork today, you will receive an invoice from [facility]. Please contact [facility] at [phone] with questions or concerns regarding your invoice.  ° °Our billing staff will not be able to assist you with questions regarding bills from these companies. ° °You will be contacted with the lab results as soon as they are available. The fastest way to get your results is to activate your My Chart account. Instructions are located on the last page of this paperwork. If you have not heard from us regarding the results in 2 weeks, please contact this office. °  ° ° ° °

## 2019-08-23 NOTE — Progress Notes (Signed)
Chad Juarez is a pleasant 69 y.o. male presenting today for Medicare Annual Wellness Visit.   Date of last exam: 08/22/18  Interpreter used for this visit? no  Patient Care Team: Maximiano Coss, NP as PCP - General (Adult Health Nurse Practitioner) Ralene Bathe, MD as Consulting Physician (Ophthalmology) Lavonna Monarch, MD as Consulting Physician (Dermatology) Belva Crome, MD as Consulting Physician (Cardiology)   Other items to address today: history of hyperlipidemia   Other Screening: Last screening for diabetes:  No results found for: HGBA1C  Last lipid screening: Lab Results  Component Value Date   CHOL 150 08/22/2018   HDL 62 08/22/2018   LDLCALC 76 08/22/2018   TRIG 59 08/22/2018   CHOLHDL 2.4 08/22/2018     ADVANCE DIRECTIVES: Discussed: yes On File: no Materials Provided: pt declined  Immunization status:  Immunization History  Administered Date(s) Administered   Influenza Split 12/22/2011   Influenza,inj,Quad PF,6+ Mos 12/19/2012, 12/21/2013, 12/14/2014, 12/20/2015, 12/18/2016   PFIZER SARS-COV-2 Vaccination 03/15/2019, 04/02/2019   Pneumococcal Conjugate-13 08/22/2018   Pneumococcal-Unspecified 06/23/2008   Td 06/23/2008   Tdap 08/13/2016     Health Maintenance Due  Topic Date Due   PNA vac Low Risk Adult (2 of 2 - PPSV23) 08/22/2019     Functional Status Survey: Is the patient deaf or have difficulty hearing?: No Does the patient have difficulty seeing, even when wearing glasses/contacts?: No Does the patient have difficulty concentrating, remembering, or making decisions?: No Does the patient have difficulty walking or climbing stairs?: No Does the patient have difficulty dressing or bathing?: No Does the patient have difficulty doing errands alone such as visiting a doctor's office or shopping?: NoPt declines any functional limitations Increased stress due to covid, loss of pet, and father's illness However no  further concerns regarding ADLs.   6CIT Screen 08/23/2019  What Year? 0 points  What month? 0 points  What time? 0 points  Count back from 20 0 points  Months in reverse 0 points  Repeat phrase 0 points  Total Score 0        Office Visit from 08/23/2019 in [facility]  AUDIT-C Score 2       Home Environment:  Safe Lives with partner Has loved ones he can call if needed.    Patient Active Problem List   Diagnosis Date Noted   Irregular heart beat 05/17/2016   Hyperlipidemia with target LDL less than 100 06/29/2012     Past Medical History:  Diagnosis Date   Allergy    Atypical nevus 11/28/2008   Left Sideburn (w/s)   Basal cell carcinoma 11/28/2008   Left Nose (Cx3,Exc)   Depression    Diverticulosis 10/2004   GERD (gastroesophageal reflux disease) 07/2001   GI bleeding 07/452   History of Helicobacter pylori infection 2004   History of kidney stones 04/1989   HLD (hyperlipidemia) 12/2002   Nodular basal cell carcinoma (BCC) 05/23/2014   Right Ear Rim (Cx3,Cautery)   Ventral hernia      Past Surgical History:  Procedure Laterality Date   COLECTOMY  02/05/2010   KIDNEY STONE SURGERY     TONSILLECTOMY       Family History  Problem Relation Age of Onset   Colon polyps Father    Hypertension Father    Heart disease Father    Hyperlipidemia Father    Clotting disorder Mother        pulmonary embolism   Heart disease Mother  Hyperlipidemia Mother    Hypertension Mother    Mental illness Mother    Hodgkin's lymphoma Other        nephew   Hyperlipidemia Brother    Hyperlipidemia Sister    Heart disease Sister    Diabetes Paternal Grandfather    Heart disease Paternal Grandfather    Hyperlipidemia Paternal Grandfather    Hypertension Paternal Grandfather    Stroke Paternal Grandfather    Heart disease Maternal Grandmother    Hyperlipidemia Maternal Grandmother    Heart disease Maternal Grandfather     Hyperlipidemia Maternal Grandfather    Hypertension Maternal Grandfather    Heart disease Paternal Grandmother    Hyperlipidemia Paternal Grandmother      Social History   Socioeconomic History   Marital status: Single    Spouse name: Not on file   Number of children: 0   Years of education: Not on file   Highest education level: Not on file  Occupational History   Occupation: retired Marine scientist: RETIRED  Tobacco Use   Smoking status: Former Smoker    Packs/day: 1.50    Years: 20.00    Pack years: 30.00    Types: Cigarettes    Quit date: 08/13/2000    Years since quitting: 19.0   Smokeless tobacco: Never Used  Vaping Use   Vaping Use: Never used  Substance and Sexual Activity   Alcohol use: Yes    Alcohol/week: 12.0 - 14.0 standard drinks    Types: 12 - 14 Standard drinks or equivalent per week    Comment: wine with dinner 10-14 drinks    Drug use: No   Sexual activity: Not Currently  Other Topics Concern   Not on file  Social History Narrative   Single. Education:    Exercise walking 2-4 miles/week   Social Determinants of Health   Financial Resource Strain:    Difficulty of Paying Living Expenses:   Food Insecurity:    Worried About Charity fundraiser in the Last Year:    Arboriculturist in the Last Year:   Transportation Needs:    Film/video editor (Medical):    Lack of Transportation (Non-Medical):   Physical Activity:    Days of Exercise per Week:    Minutes of Exercise per Session:   Stress:    Feeling of Stress :   Social Connections:    Frequency of Communication with Friends and Family:    Frequency of Social Gatherings with Friends and Family:    Attends Religious Services:    Active Member of Clubs or Organizations:    Attends Music therapist:    Marital Status:   Intimate Partner Violence:    Fear of Current or Ex-Partner:    Emotionally Abused:    Physically Abused:     Sexually Abused:      No Known Allergies   Prior to Admission medications   Medication Sig Start Date End Date Taking? Authorizing Provider  aspirin 81 MG tablet Take 81 mg by mouth daily. Reported on 08/01/2015   Yes [provider]  fluticasone (FLONASE) 50 MCG/ACT nasal spray Place 2 sprays into both nostrils daily as needed. 08/25/18  Yes Maximiano Coss, NP  Multiple Vitamin (MULTIVITAMIN) tablet Take 1 tablet by mouth daily.   Yes [provider]  rosuvastatin (CRESTOR) 5 MG tablet TAKE 1 TABLET(5 MG) BY MOUTH DAILY 08/16/19  Yes Maximiano Coss, NP     Depression screen PHQ  2/9 08/23/2019 08/22/2018 08/19/2017 08/13/2016 04/16/2016  Decreased Interest 0 0 0 0 0  Down, Depressed, Hopeless 0 0 0 0 0  PHQ - 2 Score 0 0 0 0 0  Altered sleeping - - - - -  Tired, decreased energy - - - - -  Change in appetite - - - - -  Feeling bad or failure about yourself  - - - - -  Trouble concentrating - - - - -  Moving slowly or fidgety/restless - - - - -  Suicidal thoughts - - - - -  PHQ-9 Score - - - - -     Fall Risk  08/23/2019 08/22/2018 08/19/2017 08/13/2016 04/16/2016  Falls in the past year? 0 0 No No No  Number falls in past yr: 0 - - - -  Injury with Fall? 0 0 - - -  Follow up Falls evaluation completed - - - -      PHYSICAL EXAM: BP 128/68    Pulse 83    Temp 97.7 F (36.5 C) (Temporal)    Resp 18    Ht 5' 9.5" (1.765 m)    Wt 143 lb 9.6 oz (65.1 kg)    SpO2 97%    BMI 20.90 kg/m    Wt Readings from Last 3 Encounters:  08/23/19 143 lb 9.6 oz (65.1 kg)  08/22/18 141 lb (64 kg)  08/19/17 142 lb (64.4 kg)      Hearing Screening   125Hz  250Hz  500Hz  1000Hz  2000Hz  3000Hz  4000Hz  6000Hz  8000Hz   Right ear:           Left ear:             Visual Acuity Screening   Right eye Left eye Both eyes  Without correction:     With correction: 20/25 20/20 20/20       Physical Exam Deferred per medicare guidelines.   Education/Counseling provided regarding diet and  exercise, prevention of chronic diseases, smoking/tobacco cessation, if applicable, and reviewed "Covered Medicare Preventive Services."   ASSESSMENT/PLAN: Medicare annual wellness visit, subsequent  Hyperlipidemia with target LDL less than 100 - Plan: Comprehensive metabolic panel, Lipid Panel  History of diverticulosis - Plan: CBC  Irregular heart beat - Plan: EKG 12-Lead   EKG wnl. No concerns for abnormalities. Comparison to previous studies shows similarly normal results.   Will draw labs and follow up as warranted  Refill rosuvastatin 5mg  PO qd for 1 year  Return prn or in 1 year for AWV  Thank you for your visit with Primary Care at [facility] today.  Maximiano Coss, NP [facility] [place], [place] [address] [phone] - 0000

## 2019-09-03 NOTE — Progress Notes (Signed)
Normal results letter, please  Thank you  Kathrin Ruddy, NP

## 2019-09-04 NOTE — Progress Notes (Signed)
Lab Letter SENT  

## 2019-11-24 DEATH — deceased

## 2019-12-23 ENCOUNTER — Ambulatory Visit: Payer: Medicare Other | Attending: Internal Medicine

## 2019-12-23 DIAGNOSIS — Z23 Encounter for immunization: Secondary | ICD-10-CM

## 2019-12-23 NOTE — Progress Notes (Signed)
   Covid-19 Vaccination Clinic  Name:  Chad Juarez    MRN: [id number] DOB: [birth year]  12/23/2019  [name] was observed post Covid-19 immunization for 15 minutes without incident. He was provided with Vaccine Information Sheet and instruction to access the V-Safe system.   [name] was instructed to call 911 with any severe reactions post vaccine: Marland Kitchen Difficulty breathing  . Swelling of face and throat  . A fast heartbeat  . A bad rash all over body  . Dizziness and weakness

## 2020-01-03 ENCOUNTER — Other Ambulatory Visit: Payer: Self-pay | Admitting: Registered Nurse

## 2020-01-03 DIAGNOSIS — J302 Other seasonal allergic rhinitis: Secondary | ICD-10-CM

## 2020-01-26 ENCOUNTER — Ambulatory Visit (INDEPENDENT_AMBULATORY_CARE_PROVIDER_SITE_OTHER): Payer: Medicare Other | Admitting: Registered Nurse

## 2020-01-26 ENCOUNTER — Encounter: Payer: Self-pay | Admitting: Registered Nurse

## 2020-01-26 ENCOUNTER — Other Ambulatory Visit: Payer: Self-pay

## 2020-01-26 VITALS — BP 131/65 | HR 78 | Temp 97.8°F | Resp 15 | Ht 69.5 in | Wt 143.8 lb

## 2020-01-26 DIAGNOSIS — Z636 Dependent relative needing care at home: Secondary | ICD-10-CM

## 2020-01-26 DIAGNOSIS — I493 Ventricular premature depolarization: Secondary | ICD-10-CM | POA: Diagnosis not present

## 2020-01-26 NOTE — Progress Notes (Signed)
Established Patient Office Visit  Subjective:  Patient ID: Chad Juarez, male    DOB: [birth year]  Age: 69 y.o. MRN: [id number]  CC:  Chief Complaint  Patient presents with  . Depression    PHQ 9 score 12 pt reports some struggles recently as his father is not in good health at this time   . Tachycardia    pt complains he has had episodes of PVC recently beleievs due to stress and came for advice     HPI Chad Juarez presents for frequent PVCs and caregiver fatigue  Frequent PVCs: has been worked up in the past by cardiology, was told to follow up PRN - has been 3 years since that visit, no new issues arisen until now. Has some PVCs after every meal, but notices that they are also coming on with stress. They are lasting longer and more difficult to resolve. His bp and pulse have been labile with a lot of stress lately as well. No lightheadedness, dizziness, LOC, chest pain, edema, or other acute symptoms  Caregiver burden: out of his 3 siblings he has been the one to give most care for his mother, who passed away 2 years ago after a 54 year battle with Alzheimer's, and his father, who is experiencing a myriad of medical and psychological concerns at this time. This has led to more anxiety, stress, feeling overwhelmed, and frustration for Chad Juarez. He believes this to be the major contributor to his frequent PVCs. He denies HI/SI. Declines medication at this time. Interested in counseling referral.   Past Medical History:  Diagnosis Date  . Allergy   . Atypical nevus 11/28/2008   Left Sideburn (w/s)  . Basal cell carcinoma 11/28/2008   Left Nose (Cx3,Exc)  . Depression   . Diverticulosis 10/2004  . GERD (gastroesophageal reflux disease) 07/2001  . GI bleeding 09/2004  . History of Helicobacter pylori infection 2004  . History of kidney stones 04/1989  . HLD (hyperlipidemia) 12/2002  . Nodular basal cell carcinoma (BCC) 05/23/2014   Right Ear Rim (Cx3,Cautery)  . Ventral hernia       Past Surgical History:  Procedure Laterality Date  . COLECTOMY  02/05/2010  . KIDNEY STONE SURGERY    . TONSILLECTOMY      Family History  Problem Relation Age of Onset  . Colon polyps Father   . Hypertension Father   . Heart disease Father   . Hyperlipidemia Father   . Clotting disorder Mother        pulmonary embolism  . Heart disease Mother   . Hyperlipidemia Mother   . Hypertension Mother   . Mental illness Mother   . Hodgkin's lymphoma Other        nephew  . Hyperlipidemia Brother   . Hyperlipidemia Sister   . Heart disease Sister   . Diabetes Paternal Grandfather   . Heart disease Paternal Grandfather   . Hyperlipidemia Paternal Grandfather   . Hypertension Paternal Grandfather   . Stroke Paternal Grandfather   . Heart disease Maternal Grandmother   . Hyperlipidemia Maternal Grandmother   . Heart disease Maternal Grandfather   . Hyperlipidemia Maternal Grandfather   . Hypertension Maternal Grandfather   . Heart disease Paternal Grandmother   . Hyperlipidemia Paternal Grandmother     Social History   Socioeconomic History  . Marital status: Single    Spouse name: Not on file  . Number of children: 0  . Years of education: Not on file  .  Highest education level: Not on file  Occupational History  . Occupation: retired Marine scientist: RETIRED  Tobacco Use  . Smoking status: Former Smoker    Packs/day: 1.50    Years: 20.00    Pack years: 30.00    Types: Cigarettes    Quit date: 08/13/2000    Years since quitting: 19.4  . Smokeless tobacco: Never Used  Vaping Use  . Vaping Use: Never used  Substance and Sexual Activity  . Alcohol use: Yes    Alcohol/week: 12.0 - 14.0 standard drinks    Types: 12 - 14 Standard drinks or equivalent per week    Comment: wine with dinner 10-14 drinks   . Drug use: No  . Sexual activity: Not Currently  Other Topics Concern  . Not on file  Social History Narrative   Single. Education:    Exercise walking  2-4 miles/week   Social Determinants of Health   Financial Resource Strain:   . Difficulty of Paying Living Expenses: Not on file  Food Insecurity:   . Worried About Charity fundraiser in the Last Year: Not on file  . Ran Out of Food in the Last Year: Not on file  Transportation Needs:   . Lack of Transportation (Medical): Not on file  . Lack of Transportation (Non-Medical): Not on file  Physical Activity:   . Days of Exercise per Week: Not on file  . Minutes of Exercise per Session: Not on file  Stress:   . Feeling of Stress : Not on file  Social Connections:   . Frequency of Communication with Friends and Family: Not on file  . Frequency of Social Gatherings with Friends and Family: Not on file  . Attends Religious Services: Not on file  . Active Member of Clubs or Organizations: Not on file  . Attends Archivist Meetings: Not on file  . Marital Status: Not on file  Intimate Partner Violence:   . Fear of Current or Ex-Partner: Not on file  . Emotionally Abused: Not on file  . Physically Abused: Not on file  . Sexually Abused: Not on file    Outpatient Medications Prior to Visit  Medication Sig Dispense Refill  . aspirin 81 MG tablet Take 81 mg by mouth daily. Reported on 08/01/2015    . fluticasone (FLONASE) 50 MCG/ACT nasal spray SHAKE LIQUID AND USE 2 SPRAYS IN EACH NOSTRIL DAILY AS NEEDED 16 g 11  . Multiple Vitamin (MULTIVITAMIN) tablet Take 1 tablet by mouth daily.    . rosuvastatin (CRESTOR) 5 MG tablet TAKE 1 TABLET(5 MG) BY MOUTH DAILY 90 tablet 3   No facility-administered medications prior to visit.    No Known Allergies  ROS Review of Systems  Constitutional: Negative.   HENT: Negative.   Eyes: Negative.   Respiratory: Negative.   Cardiovascular: Positive for palpitations. Negative for chest pain and leg swelling.  Gastrointestinal: Negative.   Genitourinary: Negative.   Musculoskeletal: Negative.   Skin: Negative.   Neurological: Negative.    Psychiatric/Behavioral: Positive for dysphoric mood. Negative for agitation, behavioral problems, confusion, decreased concentration, hallucinations, self-injury, sleep disturbance and suicidal ideas. The patient is nervous/anxious. The patient is not hyperactive.       Objective:    Physical Exam Constitutional:      General: He is not in acute distress.    Appearance: Normal appearance. He is normal weight. He is not ill-appearing, toxic-appearing or diaphoretic.  Cardiovascular:     Rate and  Rhythm: Normal rate. Rhythm irregular.     Heart sounds: Normal heart sounds. No murmur heard.  No friction rub. No gallop.      Comments: occ PVCs noted.  Pulmonary:     Effort: Pulmonary effort is normal. No respiratory distress.     Breath sounds: Normal breath sounds. No stridor. No wheezing, rhonchi or rales.  Chest:     Chest wall: No tenderness.  Skin:    General: Skin is warm and dry.     Coloration: Skin is not jaundiced or pale.     Findings: No bruising, erythema, lesion or rash.  Neurological:     General: No focal deficit present.     Mental Status: He is alert and oriented to person, place, and time. Mental status is at baseline.  Psychiatric:        Mood and Affect: Mood normal.        Behavior: Behavior normal.        Thought Content: Thought content normal.        Judgment: Judgment normal.     BP 131/65   Pulse 78   Temp 97.8 F (36.6 C) (Temporal)   Resp 15   Ht 5' 9.5" (1.765 m)   Wt 143 lb 12.8 oz (65.2 kg)   SpO2 97%   BMI 20.93 kg/m  Wt Readings from Last 3 Encounters:  01/26/20 143 lb 12.8 oz (65.2 kg)  08/23/19 143 lb 9.6 oz (65.1 kg)  08/22/18 141 lb (64 kg)     There are no preventive care reminders to display for this patient.  There are no preventive care reminders to display for this patient.  Lab Results  Component Value Date   TSH 1.580 04/16/2016   Lab Results  Component Value Date   WBC 5.3 08/23/2019   HGB 15.0 08/23/2019    HCT 44.8 08/23/2019   MCV 90 08/23/2019   PLT 185 08/23/2019   Lab Results  Component Value Date   NA 141 08/23/2019   K 4.5 08/23/2019   CO2 23 08/23/2019   GLUCOSE 85 08/23/2019   BUN 19 08/23/2019   CREATININE 0.86 08/23/2019   BILITOT 0.8 08/23/2019   ALKPHOS 77 08/23/2019   AST 18 08/23/2019   ALT 15 08/23/2019   PROT 6.9 08/23/2019   ALBUMIN 5.2 (H) 08/23/2019   CALCIUM 10.0 08/23/2019   Lab Results  Component Value Date   CHOL 150 08/23/2019   Lab Results  Component Value Date   HDL 61 08/23/2019   Lab Results  Component Value Date   LDLCALC 79 08/23/2019   Lab Results  Component Value Date   TRIG 47 08/23/2019   Lab Results  Component Value Date   CHOLHDL 2.5 08/23/2019   No results found for: HGBA1C    Assessment & Plan:   Problem List Items Addressed This Visit      Cardiovascular and Mediastinum   Frequent PVCs - Primary   Relevant Orders   EKG 12-Lead (Completed)   Comprehensive metabolic panel   CBC   Magnesium   Ambulatory referral to Cardiology     Other   Caregiver burden   Relevant Orders   Ambulatory referral to Psychology      No orders of the defined types were placed in this encounter.   Follow-up: No follow-ups on file.   PLAN  EKG done today, compared to 6/30.21 in our office. Shows likely new benign early repolarization with [place] pattern in some leads -  pt is low risk for having any superimposed pericarditis. Given his hx, no acute concerns. Unable to capture PVC on EKG.   Refer to cardiology   Refer to counseling  Collect CMP, CBC, and Magnesium to check for any metabolic abnormalities contributing to PVCs  Patient encouraged to call clinic with any questions, comments, or concerns.  Maximiano Coss, NP

## 2020-01-26 NOTE — Patient Instructions (Signed)
° ° ° °  If you have lab work done today you will be contacted with your lab results within the next 2 weeks.  If you have not heard from us then please contact us. The fastest way to get your results is to register for My Chart. ° ° °IF you received an x-ray today, you will receive an invoice from [facility]. Please contact [facility] at [phone] with questions or concerns regarding your invoice.  ° °IF you received labwork today, you will receive an invoice from [facility]. Please contact [facility] at [phone] with questions or concerns regarding your invoice.  ° °Our billing staff will not be able to assist you with questions regarding bills from these companies. ° °You will be contacted with the lab results as soon as they are available. The fastest way to get your results is to activate your My Chart account. Instructions are located on the last page of this paperwork. If you have not heard from us regarding the results in 2 weeks, please contact this office. °  ° ° ° °

## 2020-01-27 LAB — CBC
Hematocrit: 45.3 % (ref 37.5–51.0)
Hemoglobin: 14.9 g/dL (ref 13.0–17.7)
MCH: 29.4 pg (ref 26.6–33.0)
MCHC: 32.9 g/dL (ref 31.5–35.7)
MCV: 89 fL (ref 79–97)
Platelets: 217 10*3/uL (ref 150–450)
RBC: 5.07 x10E6/uL (ref 4.14–5.80)
RDW: 11.9 % (ref 11.6–15.4)
WBC: 5.9 10*3/uL (ref 3.4–10.8)

## 2020-01-27 LAB — COMPREHENSIVE METABOLIC PANEL
ALT: 26 IU/L (ref 0–44)
AST: 17 IU/L (ref 0–40)
Albumin/Globulin Ratio: 2.6 — ABNORMAL HIGH (ref 1.2–2.2)
Albumin: 4.9 g/dL — ABNORMAL HIGH (ref 3.8–4.8)
Alkaline Phosphatase: 75 IU/L (ref 44–121)
BUN/Creatinine Ratio: 24 (ref 10–24)
BUN: 19 mg/dL (ref 8–27)
Bilirubin Total: 0.6 mg/dL (ref 0.0–1.2)
CO2: 25 mmol/L (ref 20–29)
Calcium: 10 mg/dL (ref 8.6–10.2)
Chloride: 99 mmol/L (ref 96–106)
Creatinine, Ser: 0.8 mg/dL (ref 0.76–1.27)
GFR calc Af Amer: 106 mL/min/{1.73_m2} (ref >59)
GFR calc non Af Amer: 92 mL/min/{1.73_m2} (ref >59)
Globulin, Total: 1.9 g/dL (ref 1.5–4.5)
Glucose: 108 mg/dL — ABNORMAL HIGH (ref 65–99)
Potassium: 4.3 mmol/L (ref 3.5–5.2)
Sodium: 139 mmol/L (ref 134–144)
Total Protein: 6.8 g/dL (ref 6.0–8.5)

## 2020-01-27 LAB — MAGNESIUM: Magnesium: 2.3 mg/dL (ref 1.6–2.3)

## 2020-01-30 ENCOUNTER — Other Ambulatory Visit: Payer: Self-pay | Admitting: Registered Nurse

## 2020-01-30 DIAGNOSIS — Z636 Dependent relative needing care at home: Secondary | ICD-10-CM

## 2020-03-03 NOTE — Progress Notes (Signed)
Cardiology Office Note:    Date:  03/04/2020   ID:  Chad Juarez, DOB 1950-04-23, MRN [id number]  PCP:  Janeece Agee, NP  Cardiologist:  Lesleigh Noe, MD   Referring MD: Janeece Agee, NP   Chief Complaint  Patient presents with  . Chest Pain  . Irregular Heart Beat    History of Present Illness:    Chad Juarez is a 70 y.o. male with a hx of palpitations, PVC's depression, hyperlipidemia, and prior heavy smoker d/c 2002.  He is a Teacher, early years/pre.  Starting in November he had marked increase in palpitations.  They were very frequent, sometimes with runs that were associated with lightheadedness.  Starting 1 January things significantly improved.  He denies chest pain.  No change in exertional tolerance.  Smoker.  He is having atypical discomfort.  Its is mostly due to PVCs.  Both his mother and father had coronary bypass surgery.  He is concerned that he may have coronary disease.  These PVCs/arrhythmia can be almost unbearable.  He had a treadmill test done in 2018 that did not reveal any evidence of ischemia.  He had good exercise tolerance.  The monitor revealed PACs PVCs ventricular couplets and symptoms of fluttering seem to correlate with PVCs.    Past Medical History:  Diagnosis Date  . Allergy   . Atypical nevus 11/28/2008   Left Sideburn (w/s)  . Basal cell carcinoma 11/28/2008   Left Nose (Cx3,Exc)  . Depression   . Diverticulosis 10/2004  . GERD (gastroesophageal reflux disease) 07/2001  . GI bleeding 09/2004  . History of Helicobacter pylori infection 2004  . History of kidney stones 04/1989  . HLD (hyperlipidemia) 12/2002  . Nodular basal cell carcinoma (BCC) 05/23/2014   Right Ear Rim (Cx3,Cautery)  . Ventral hernia     Past Surgical History:  Procedure Laterality Date  . COLECTOMY  02/05/2010  . KIDNEY STONE SURGERY    . TONSILLECTOMY      Current Medications: Current Meds  Medication Sig  . aspirin 81 MG tablet Take 81 mg by mouth daily.  Reported on 08/01/2015  . fluticasone (FLONASE) 50 MCG/ACT nasal spray SHAKE LIQUID AND USE 2 SPRAYS IN EACH NOSTRIL DAILY AS NEEDED  . Multiple Vitamin (MULTIVITAMIN) tablet Take 1 tablet by mouth daily.  . rosuvastatin (CRESTOR) 5 MG tablet TAKE 1 TABLET(5 MG) BY MOUTH DAILY     Allergies:   Patient has no known allergies.   Social History   Socioeconomic History  . Marital status: Single    Spouse name: Not on file  . Number of children: 0  . Years of education: Not on file  . Highest education level: Not on file  Occupational History  . Occupation: retired Printmaker: RETIRED  Tobacco Use  . Smoking status: Former Smoker    Packs/day: 1.50    Years: 20.00    Pack years: 30.00    Types: Cigarettes    Quit date: 08/13/2000    Years since quitting: 19.5  . Smokeless tobacco: Never Used  Vaping Use  . Vaping Use: Never used  Substance and Sexual Activity  . Alcohol use: Yes    Alcohol/week: 12.0 - 14.0 standard drinks    Types: 12 - 14 Standard drinks or equivalent per week    Comment: wine with dinner 10-14 drinks   . Drug use: No  . Sexual activity: Not Currently  Other Topics Concern  . Not on file  Social  History Narrative   Single. Education:    Exercise walking 2-4 miles/week   Social Determinants of Health   Financial Resource Strain: Not on file  Food Insecurity: Not on file  Transportation Needs: Not on file  Physical Activity: Not on file  Stress: Not on file  Social Connections: Not on file     Family History: The patient's family history includes Clotting disorder in his mother; Colon polyps in his father; Diabetes in his paternal grandfather; Heart disease in his father, maternal grandfather, maternal grandmother, mother, paternal grandfather, paternal grandmother, and sister; Hodgkin's lymphoma in an other family member; Hyperlipidemia in his brother, father, maternal grandfather, maternal grandmother, mother, paternal grandfather, paternal  grandmother, and sister; Hypertension in his father, maternal grandfather, mother, and paternal grandfather; Mental illness in his mother; Stroke in his paternal grandfather.  ROS:   Please see the history of present illness.    Does not sleep well.  Used to snore when he was a child.  Does not believe he has sleep apnea.  All other systems reviewed and are negative.  EKGs/Labs/Other Studies Reviewed:    The following studies were reviewed today: Sinus treadmill test May 28, 2017 Study Highlights    Blood pressure demonstrated a normal response to exercise.  No T wave inversion was noted during stress.  There was no ST segment deviation noted during stress.  Arrhythmias during stress: rare PVCs. Arrhythmias during recovery: rare PVCs. Ventricular couplets  Blood pressure and heart rate demonstrated a normal response to exercise  Overall, the patient's exercise capacity was normal.  [facility] Treadmill Score: low risk   Negative stress test without evidence of ischemia at given workload. PVC's and couplets noted during exercise and recovery. pril 5, 2018:  Abdominal duplex scan performed on 08/25/2017: Final Interpretation:  Abdominal Aorta: No evidence of an abdominal aortic aneurysm was  visualized. The largest aortic measurement is 1.6 cm. Velocity in the  distal aorta is in the >50% range with plaque visualized.   Monitor performed May 28, 2016: Study Highlights    Basic rhythm is normal sinus  Flutter correlates with PVCs and ventricular couplets  No sustained tachycardia or bradycardia arrhythmias  No atrial fibrillation   Normal sinus rhythm PVCs with occasional ventricular couplets and symptoms of flutter associated.   EKG:  EKG 01/26/2020 tracing demonstrates rhythm with prominent voltage.  Recent Labs: 01/26/2020: ALT 26; BUN 19; Creatinine, Ser 0.80; Hemoglobin 14.9; Magnesium 2.3; Platelets 217; Potassium 4.3; Sodium 139  Recent Lipid Panel     Component Value Date/Time   CHOL 150 08/23/2019 0848   TRIG 47 08/23/2019 0848   HDL 61 08/23/2019 0848   CHOLHDL 2.5 08/23/2019 0848   CHOLHDL 2.0 08/01/2015 0846   VLDL 13 08/01/2015 0846   LDLCALC 79 08/23/2019 0848    Physical Exam:    VS:  BP 130/68   Pulse 87   Ht 5' 9.5" (1.765 m)   Wt 143 lb 1.3 oz (64.9 kg)   SpO2 96%   BMI 20.83 kg/m     Wt Readings from Last 3 Encounters:  03/04/20 143 lb 1.3 oz (64.9 kg)  01/26/20 143 lb 12.8 oz (65.2 kg)  08/23/19 143 lb 9.6 oz (65.1 kg)     GEN: Healthy appearing. No acute distress HEENT: Normal NECK: No JVD. LYMPHATICS: No lymphadenopathy CARDIAC: No murmur.  Irregular due to premature beats RR otherwise okay.  No gallop, or edema. VASCULAR:  Normal Pulses. No bruits. RESPIRATORY:  Clear to auscultation without  rales, wheezing or rhonchi  ABDOMEN: Soft, non-tender, non-distended, No pulsatile mass, MUSCULOSKELETAL: No deformity  SKIN: Warm and dry NEUROLOGIC:  Alert and oriented x 3 PSYCHIATRIC:  Normal affect   ASSESSMENT:    1. Frequent PVCs   2. Hyperlipidemia with target LDL less than 100   3. Former smoker, stopped smoking in distant past   4. Chest pain of uncertain etiology   5. Precordial pain   6. Educated about COVID-19 virus infection    PLAN:    In order of problems listed above:  1. This has been documented in the past.  It was nearly on bearable in November and December and now has almost completely resolved.  On exam today, PVCs were readily noticeable. 2. Taking statin therapy. 3. Former smoker.  None for greater than 20 years. 4. Etiology is uncertain.  Given male sex, atypical chest pain, family history of CAD, hyperlipidemia, we will perform a coronary CTA with FFR if indicated to exclude the possibility that arrhythmias he is having an atypical chest pain could be related to obstructive coronary disease. 5. Same 6. Vaccinated and practicing medication.   Medication Adjustments/Labs and  Tests Ordered: Current medicines are reviewed at length with the patient today.  Concerns regarding medicines are outlined above.  No orders of the defined types were placed in this encounter.  No orders of the defined types were placed in this encounter.   There are no Patient Instructions on file for this visit.   Signed, Sinclair Grooms, MD  03/04/2020 2:56 PM    Chad Juarez

## 2020-03-04 ENCOUNTER — Other Ambulatory Visit: Payer: Self-pay

## 2020-03-04 ENCOUNTER — Encounter: Payer: Self-pay | Admitting: Interventional Cardiology

## 2020-03-04 ENCOUNTER — Ambulatory Visit (INDEPENDENT_AMBULATORY_CARE_PROVIDER_SITE_OTHER): Payer: Medicare Other | Admitting: Interventional Cardiology

## 2020-03-04 VITALS — BP 130/68 | HR 87 | Ht 69.5 in | Wt 143.1 lb

## 2020-03-04 DIAGNOSIS — Z87891 Personal history of nicotine dependence: Secondary | ICD-10-CM | POA: Diagnosis not present

## 2020-03-04 DIAGNOSIS — R072 Precordial pain: Secondary | ICD-10-CM

## 2020-03-04 DIAGNOSIS — R002 Palpitations: Secondary | ICD-10-CM

## 2020-03-04 DIAGNOSIS — I493 Ventricular premature depolarization: Secondary | ICD-10-CM

## 2020-03-04 DIAGNOSIS — E785 Hyperlipidemia, unspecified: Secondary | ICD-10-CM | POA: Diagnosis not present

## 2020-03-04 DIAGNOSIS — R079 Chest pain, unspecified: Secondary | ICD-10-CM | POA: Diagnosis not present

## 2020-03-04 DIAGNOSIS — Z7189 Other specified counseling: Secondary | ICD-10-CM

## 2020-03-04 MED ORDER — METOPROLOL TARTRATE 100 MG PO TABS: ORAL_TABLET | ORAL | 0 refills | Status: DC

## 2020-03-04 NOTE — Patient Instructions (Addendum)
Medication Instructions:  Your physician recommends that you continue on your current medications as directed. Please refer to the Current Medication list given to you today.  *If you need a refill on your cardiac medications before your next appointment, please call your pharmacy*   Lab Work: BMET today  If you have labs (blood work) drawn today and your tests are completely normal, you will receive your results only by: Marland Kitchen MyChart Message (if you have MyChart) OR . A paper copy in the mail If you have any lab test that is abnormal or we need to change your treatment, we will call you to review the results.   Testing/Procedures: Your physician has requested that you have an echocardiogram. Echocardiography is a painless test that uses sound waves to create images of your heart. It provides your doctor with information about the size and shape of your heart and how well your heart's chambers and valves are working. This procedure takes approximately one hour. There are no restrictions for this procedure.  Your physician recommends that you have a Coronary CT performed.   Follow-Up: At [facility], you and your health needs are our priority.  As part of our continuing mission to provide you with exceptional heart care, we have created designated Provider Care Teams.  These Care Teams include your primary Cardiologist (physician) and Advanced Practice Providers (APPs -  Physician Assistants and Nurse Practitioners) who all work together to provide you with the care you need, when you need it.  We recommend signing up for the patient portal called "MyChart".  Sign up information is provided on this After Visit Summary.  MyChart is used to connect with patients for Virtual Visits (Telemedicine).  Patients are able to view lab/test results, encounter notes, upcoming appointments, etc.  Non-urgent messages can be sent to your provider as well.   To learn more about what you can do with MyChart,  go to [email or web address].    Your next appointment:   6 week(s)- can have 2/22 at 2:20pm or 2/25 at 1:40pm  The format for your next appointment:   In Person  Provider:   You may see Sinclair Grooms, MD or one of the following Advanced Practice Providers on your designated Care Team:    Truitt Merle, NP  Cecilie Kicks, NP  Kathyrn Drown, NP    Other Instructions  Your cardiac CT will be scheduled at one of the below locations:   [facility] [address] [place], [place] [address] [phone]  [place] [address] [place], [place] [address] [phone]  If scheduled at [facility], please arrive at the [facility] main entrance of [facility] 30 minutes prior to test start time. Proceed to the [facility] Radiology Department (first floor) to check-in and test prep.  If scheduled at [facility] Main, please arrive 15 mins early for check-in and test prep.  Please follow these instructions carefully (unless otherwise directed):  Hold all erectile dysfunction medications at least 3 days (72 hrs) prior to test.  On the Night Before the Test: . Be sure to Drink plenty of water. . Do not consume any caffeinated/decaffeinated beverages or chocolate 12 hours prior to your test. . Do not take any antihistamines 12 hours prior to your test.  On the Day of the Test: . Drink plenty of water. Do not drink any water within one hour of the test. . Do  not eat any food 4 hours prior to the test. . You may take your regular medications prior to the test.  . Take metoprolol (Lopressor) two hours prior to test. . HOLD Furosemide/Hydrochlorothiazide morning of the test. . FEMALES- please wear underwire-free bra if available       After the Test: . Drink plenty of water. . After receiving IV contrast, you may experience a mild flushed feeling. This is normal. . On  occasion, you may experience a mild rash up to 24 hours after the test. This is not dangerous. If this occurs, you can take Benadryl 25 mg and increase your fluid intake. . If you experience trouble breathing, this can be serious. If it is severe call 911 IMMEDIATELY. If it is mild, please call our office. . If you take any of these medications: Glipizide/Metformin, Avandament, Glucavance, please do not take 48 hours after completing test unless otherwise instructed.   Once we have confirmed authorization from your insurance company, we will call you to set up a date and time for your test. Based on how quickly your insurance processes prior authorizations requests, please allow up to 4 weeks to be contacted for scheduling your Cardiac CT appointment. Be advised that routine Cardiac CT appointments could be scheduled as many as 8 weeks after your provider has ordered it.  For non-scheduling related questions, please contact the cardiac imaging nurse navigator should you have any questions/concerns: Marchia Bond, Cardiac Imaging Nurse Navigator Burley Saver, Interim Cardiac Imaging Nurse [place] [facility] Direct Office Dial: [phone]   For scheduling needs, including cancellations and rescheduling, please call [place], [phone].

## 2020-03-05 LAB — BASIC METABOLIC PANEL
BUN/Creatinine Ratio: 24 (ref 10–24)
BUN: 21 mg/dL (ref 8–27)
CO2: 27 mmol/L (ref 20–29)
Calcium: 10.6 mg/dL — ABNORMAL HIGH (ref 8.6–10.2)
Chloride: 102 mmol/L (ref 96–106)
Creatinine, Ser: 0.87 mg/dL (ref 0.76–1.27)
GFR calc Af Amer: 103 mL/min/{1.73_m2} (ref >59)
GFR calc non Af Amer: 89 mL/min/{1.73_m2} (ref >59)
Glucose: 89 mg/dL (ref 65–99)
Potassium: 4.6 mmol/L (ref 3.5–5.2)
Sodium: 142 mmol/L (ref 134–144)

## 2020-03-15 ENCOUNTER — Telehealth (HOSPITAL_COMMUNITY): Payer: Self-pay | Admitting: *Deleted

## 2020-03-15 NOTE — Telephone Encounter (Signed)
Reaching out to patient to offer assistance regarding upcoming cardiac imaging study; pt verbalizes understanding of appt date/time, parking situation and where to check in, pre-test NPO status and medications ordered, and verified current allergies; name and call back number provided for further questions should they arise  Shakelia Scrivner RN Navigator Cardiac Imaging [facility] [phone] office [phone] cell  

## 2020-03-18 ENCOUNTER — Encounter: Payer: Medicare Other | Admitting: *Deleted

## 2020-03-18 ENCOUNTER — Encounter (HOSPITAL_COMMUNITY): Payer: Self-pay

## 2020-03-18 ENCOUNTER — Other Ambulatory Visit: Payer: Self-pay

## 2020-03-18 ENCOUNTER — Ambulatory Visit (HOSPITAL_COMMUNITY)
Admission: RE | Admit: 2020-03-18 | Discharge: 2020-03-18 | Disposition: A | Discharge status: Home / Self Care | Payer: Medicare Other | Source: Ambulatory Visit | Attending: Interventional Cardiology | Admitting: Interventional Cardiology

## 2020-03-18 DIAGNOSIS — R002 Palpitations: Secondary | ICD-10-CM | POA: Diagnosis present

## 2020-03-18 DIAGNOSIS — R072 Precordial pain: Secondary | ICD-10-CM | POA: Diagnosis present

## 2020-03-18 DIAGNOSIS — E785 Hyperlipidemia, unspecified: Secondary | ICD-10-CM | POA: Insufficient documentation

## 2020-03-18 DIAGNOSIS — Z87891 Personal history of nicotine dependence: Secondary | ICD-10-CM | POA: Diagnosis present

## 2020-03-18 DIAGNOSIS — Z006 Encounter for examination for normal comparison and control in clinical research program: Secondary | ICD-10-CM

## 2020-03-18 DIAGNOSIS — R079 Chest pain, unspecified: Secondary | ICD-10-CM | POA: Diagnosis present

## 2020-03-18 DIAGNOSIS — I493 Ventricular premature depolarization: Secondary | ICD-10-CM | POA: Insufficient documentation

## 2020-03-18 MED ORDER — NITROGLYCERIN 0.4 MG SL SUBL: 0.8000 mg | SUBLINGUAL_TABLET | Freq: Once | SUBLINGUAL | Status: AC

## 2020-03-18 MED ORDER — NITROGLYCERIN 0.4 MG SL SUBL
SUBLINGUAL_TABLET | SUBLINGUAL | Status: AC
  Administered 2020-03-18: 0.8 mg via SUBLINGUAL
  Filled 2020-03-18: qty 2

## 2020-03-18 MED ORDER — IOHEXOL 350 MG/ML SOLN
80.0000 mL | Freq: Once | INTRAVENOUS | Status: AC | PRN
  Administered 2020-03-18: 80 mL via INTRAVENOUS

## 2020-03-18 NOTE — Research (Signed)
Subject Name: Chad Juarez  Subject met inclusion and exclusion criteria.  The informed consent form, study requirements and expectations were reviewed with the subject and questions and concerns were addressed prior to the signing of the consent form.  The subject verbalized understanding of the trial requirements.  The subject agreed to participate in the Identify  trial and signed the informed consent at 0734 on 03/18/20  The informed consent was obtained prior to performance of any protocol-specific procedures for the subject.  A copy of the signed informed consent was given to the subject and a copy was placed in the subject's medical record.   Star Age [place]

## 2020-03-20 ENCOUNTER — Ambulatory Visit (HOSPITAL_COMMUNITY): Payer: Medicare Other | Attending: Internal Medicine

## 2020-03-20 ENCOUNTER — Other Ambulatory Visit: Payer: Self-pay

## 2020-03-20 DIAGNOSIS — I493 Ventricular premature depolarization: Secondary | ICD-10-CM | POA: Insufficient documentation

## 2020-03-20 DIAGNOSIS — R002 Palpitations: Secondary | ICD-10-CM | POA: Insufficient documentation

## 2020-03-20 DIAGNOSIS — R079 Chest pain, unspecified: Secondary | ICD-10-CM | POA: Diagnosis present

## 2020-03-20 LAB — ECHOCARDIOGRAM COMPLETE
Area-P 1/2: 3.12 cm2
S' Lateral: 3 cm

## 2020-04-04 ENCOUNTER — Encounter: Payer: Self-pay | Admitting: Gastroenterology

## 2020-04-16 ENCOUNTER — Ambulatory Visit: Payer: Medicare Other | Admitting: Interventional Cardiology

## 2020-05-30 ENCOUNTER — Other Ambulatory Visit: Payer: Self-pay

## 2020-05-30 ENCOUNTER — Emergency Department (HOSPITAL_COMMUNITY): Payer: Medicare Other

## 2020-05-30 ENCOUNTER — Emergency Department (HOSPITAL_COMMUNITY)
Admission: EM | Admit: 2020-05-30 | Discharge: 2020-05-30 | Disposition: A | Discharge status: Home / Self Care | Payer: Medicare Other | Attending: Emergency Medicine | Admitting: Emergency Medicine

## 2020-05-30 DIAGNOSIS — I493 Ventricular premature depolarization: Secondary | ICD-10-CM | POA: Diagnosis not present

## 2020-05-30 DIAGNOSIS — Z85828 Personal history of other malignant neoplasm of skin: Secondary | ICD-10-CM | POA: Insufficient documentation

## 2020-05-30 DIAGNOSIS — Z87891 Personal history of nicotine dependence: Secondary | ICD-10-CM | POA: Insufficient documentation

## 2020-05-30 DIAGNOSIS — Z7982 Long term (current) use of aspirin: Secondary | ICD-10-CM | POA: Insufficient documentation

## 2020-05-30 DIAGNOSIS — I10 Essential (primary) hypertension: Secondary | ICD-10-CM | POA: Diagnosis not present

## 2020-05-30 DIAGNOSIS — Z79899 Other long term (current) drug therapy: Secondary | ICD-10-CM | POA: Diagnosis not present

## 2020-05-30 DIAGNOSIS — R002 Palpitations: Secondary | ICD-10-CM | POA: Diagnosis present

## 2020-05-30 LAB — BASIC METABOLIC PANEL
Anion gap: 10 (ref 5–15)
BUN: 28 mg/dL — ABNORMAL HIGH (ref 8–23)
CO2: 28 mmol/L (ref 22–32)
Calcium: 10.1 mg/dL (ref 8.9–10.3)
Chloride: 101 mmol/L (ref 98–111)
Creatinine, Ser: 0.81 mg/dL (ref 0.61–1.24)
GFR, Estimated: >60 mL/min (ref >60)
Glucose, Bld: 113 mg/dL — ABNORMAL HIGH (ref 70–99)
Potassium: 3.9 mmol/L (ref 3.5–5.1)
Sodium: 139 mmol/L (ref 135–145)

## 2020-05-30 LAB — CBC
HCT: 44.8 % (ref 39.0–52.0)
Hemoglobin: 14.8 g/dL (ref 13.0–17.0)
MCH: 29.9 pg (ref 26.0–34.0)
MCHC: 33 g/dL (ref 30.0–36.0)
MCV: 90.5 fL (ref 80.0–100.0)
Platelets: 188 10*3/uL (ref 150–400)
RBC: 4.95 MIL/uL (ref 4.22–5.81)
RDW: 12.5 % (ref 11.5–15.5)
WBC: 5.3 10*3/uL (ref 4.0–10.5)
nRBC: 0 % (ref 0.0–0.2)

## 2020-05-30 LAB — TROPONIN I (HIGH SENSITIVITY): Troponin I (High Sensitivity): 3 ng/L (ref <18)

## 2020-05-30 MED ORDER — METOPROLOL TARTRATE 25 MG PO TABS
12.5000 mg | ORAL_TABLET | Freq: Once | ORAL | Status: AC
  Administered 2020-05-30: 12.5 mg via ORAL
  Filled 2020-05-30: qty 1

## 2020-05-30 MED ORDER — METOPROLOL TARTRATE 25 MG PO TABS: 12.5000 mg | ORAL_TABLET | Freq: Two times a day (BID) | ORAL | 0 refills | Status: DC | PRN

## 2020-05-30 NOTE — ED Notes (Signed)
Pt ambulatory to bathroom. Urine sample at bedside if needed.

## 2020-05-30 NOTE — ED Notes (Signed)
ED Provider at bedside. 

## 2020-05-30 NOTE — ED Notes (Signed)
Patient transported to X-ray 

## 2020-05-30 NOTE — ED Provider Notes (Signed)
[facility] DEPT Provider Note   CSN: [id number] Arrival date & time: 05/30/20  1739     History Chief Complaint  Patient presents with  . Tachycardia  . Hypertension    Chad Juarez is a 70 y.o. male.  70yo M w/ h/o GERD, PVCs, HLD who p/w palpitations. Patient reports history of palpitations for which she is previously followed with cardiology, Dr. Tamala Julian.  He was last evaluated in January, at which time the PVCs were more well controlled and he was not having significant symptoms.  He had a reassuring coronary CT and echo and was told to follow-up in 1 year.  Over the past couple of days, he feels like his PVCs have been worse and he has been feeling skipped heartbeats and palpitations.  He exercises at the gym this morning without problems.  This afternoon, the PVCs got worse and he checked his blood pressure and it was elevated.  His heart rate was variable.  He denies any recent changes to his medicines, caffeine use, stimulant use, drug or alcohol problems, dehydration, or recent illness.  He denies any associated chest pain.  He feels short of breath only when the PVCs are happening.  The history is provided by the patient.  Hypertension       Past Medical History:  Diagnosis Date  . Allergy   . Atypical nevus 11/28/2008   Left Sideburn (w/s)  . Basal cell carcinoma 11/28/2008   Left Nose (Cx3,Exc)  . Depression   . Diverticulosis 10/2004  . GERD (gastroesophageal reflux disease) 07/2001  . GI bleeding 09/2004  . History of Helicobacter pylori infection 2004  . History of kidney stones 04/1989  . HLD (hyperlipidemia) 12/2002  . Nodular basal cell carcinoma (BCC) 05/23/2014   Right Ear Rim (Cx3,Cautery)  . Ventral hernia     Patient Active Problem List   Diagnosis Date Noted  . Frequent PVCs 01/26/2020  . Caregiver burden 01/26/2020  . Irregular heart beat 05/17/2016  . Hyperlipidemia with target LDL less than 100 06/29/2012    Past  Surgical History:  Procedure Laterality Date  . COLECTOMY  02/05/2010  . KIDNEY STONE SURGERY    . TONSILLECTOMY         Family History  Problem Relation Age of Onset  . Colon polyps Father   . Hypertension Father   . Heart disease Father   . Hyperlipidemia Father   . Clotting disorder Mother        pulmonary embolism  . Heart disease Mother   . Hyperlipidemia Mother   . Hypertension Mother   . Mental illness Mother   . Hodgkin's lymphoma Other        nephew  . Hyperlipidemia Brother   . Hyperlipidemia Sister   . Heart disease Sister   . Diabetes Paternal Grandfather   . Heart disease Paternal Grandfather   . Hyperlipidemia Paternal Grandfather   . Hypertension Paternal Grandfather   . Stroke Paternal Grandfather   . Heart disease Maternal Grandmother   . Hyperlipidemia Maternal Grandmother   . Heart disease Maternal Grandfather   . Hyperlipidemia Maternal Grandfather   . Hypertension Maternal Grandfather   . Heart disease Paternal Grandmother   . Hyperlipidemia Paternal Grandmother     Social History   Tobacco Use  . Smoking status: Former Smoker    Packs/day: 1.50    Years: 20.00    Pack years: 30.00    Types: Cigarettes    Quit date: 08/13/2000  Years since quitting: 19.8  . Smokeless tobacco: Never Used  Vaping Use  . Vaping Use: Never used  Substance Use Topics  . Alcohol use: Yes    Alcohol/week: 12.0 - 14.0 standard drinks    Types: 12 - 14 Standard drinks or equivalent per week    Comment: wine with dinner 10-14 drinks   . Drug use: No    Home Medications Prior to Admission medications   Medication Sig Start Date End Date Taking? Authorizing Provider  aspirin 81 MG tablet Take 81 mg by mouth daily. Reported on 08/01/2015   Yes [provider]  fluticasone (FLONASE) 50 MCG/ACT nasal spray SHAKE LIQUID AND USE 2 SPRAYS IN EACH NOSTRIL DAILY AS NEEDED Patient taking differently: Place 1 spray into both nostrils daily. 01/03/20  Yes  Maximiano Coss, NP  metoprolol tartrate (LOPRESSOR) 25 MG tablet Take 0.5 tablets (12.5 mg total) by mouth 2 (two) times daily as needed (palpitations/PVCs). 05/30/20 06/29/20 Yes Dione Petron, Wenda Overland, MD  Multiple Vitamin (MULTIVITAMIN) tablet Take 1 tablet by mouth daily.   Yes [provider]  rosuvastatin (CRESTOR) 5 MG tablet TAKE 1 TABLET(5 MG) BY MOUTH DAILY Patient taking differently: Take 5 mg by mouth daily. 08/16/19  Yes Maximiano Coss, NP    Allergies    Patient has no known allergies.  Review of Systems   Review of Systems All other systems reviewed and are negative except that which was mentioned in HPI  Physical Exam Updated Vital Signs BP 126/60 (BP Location: Left Arm)   Pulse (!) 36   Temp 97.9 F (36.6 C) (Oral)   Resp 15   Ht 5' 9.5" (1.765 m)   Wt 63.5 kg   SpO2 96%   BMI 20.38 kg/m   Physical Exam Constitutional:      General: He is not in acute distress.    Appearance: Normal appearance.  HENT:     Head: Normocephalic and atraumatic.  Eyes:     Conjunctiva/sclera: Conjunctivae normal.  Cardiovascular:     Rate and Rhythm: Normal rate. Rhythm irregular.     Pulses: Normal pulses.     Heart sounds: Normal heart sounds. No murmur heard.     Comments: Frequent skipped beats Pulmonary:     Effort: Pulmonary effort is normal.     Breath sounds: Normal breath sounds.  Abdominal:     General: Abdomen is flat. Bowel sounds are normal. There is no distension.     Palpations: Abdomen is soft.     Tenderness: There is no abdominal tenderness.  Musculoskeletal:     Right lower leg: No edema.     Left lower leg: No edema.  Skin:    General: Skin is warm and dry.  Neurological:     Mental Status: He is alert and oriented to person, place, and time.     Comments: fluent  Psychiatric:        Mood and Affect: Mood normal.        Behavior: Behavior normal.     ED Results / Procedures / Treatments   Labs (all labs ordered are listed, but only  abnormal results are displayed) Labs Reviewed  BASIC METABOLIC PANEL - Abnormal; Notable for the following components:      Result Value   Glucose, Bld 113 (*)    BUN 28 (*)    All other components within normal limits  CBC  TROPONIN I (HIGH SENSITIVITY)  TROPONIN I (HIGH SENSITIVITY)    EKG EKG Interpretation  Date/Time:  Thursday May 30 2020 17:49:56 EDT Ventricular Rate:  102 PR Interval:  135 QRS Duration: 93 QT Interval:  385 QTC Calculation: 382 R Axis:   75 Text Interpretation: Sinus tachycardia Ventricular bigeminy bigeminy new from previous Confirmed by Theotis Burrow [phone]) on 05/30/2020 6:47:42 PM   Radiology DG Chest 2 View  Result Date: 05/30/2020 CLINICAL DATA:  Irregular heartbeat EXAM: CHEST - 2 VIEW COMPARISON:  01/31/2010 FINDINGS: Mild hyperinflation. Heart and mediastinal contours are within normal limits. No focal opacities or effusions. No acute bony abnormality. IMPRESSION: Hyperinflation.  No active disease. Electronically Signed   By: Rolm Baptise M.D.   On: 05/30/2020 18:24    Procedures Procedures   Medications Ordered in ED Medications  metoprolol tartrate (LOPRESSOR) tablet 12.5 mg (12.5 mg Oral Given 05/30/20 2155)    ED Course  I have reviewed the triage vital signs and the nursing notes.  Pertinent labs & imaging results that were available during my care of the patient were reviewed by me and considered in my medical decision making (see chart for details).    MDM Rules/Calculators/A&P                          Alert and comfortable on exam, initially mildly hypertensive.  EKG showing ventricular bigeminy.  At bedside, patient jumped in and out of bigeminy versus sinus rhythm.  No chest pain or other symptoms to suggest ACS.  Electrolytes here are normal with potassium 3.9.  Discussed with cardiology on-call, Dr. Alfred Levins, who recommended starting metoprolol 12.5mg  prn, up to twice daily for his symptoms.  Instructed patient to contact Dr. Tamala Julian  for follow-up appointment and provided with first dose of medication here as well as prescription to continue medication at home.  Final Clinical Impression(s) / ED Diagnoses Final diagnoses:  Frequent PVCs    Rx / DC Orders ED Discharge Orders         Ordered    metoprolol tartrate (LOPRESSOR) 25 MG tablet  2 times daily PRN        05/30/20 2221           Wilbert Schouten, Wenda Overland, MD 05/30/20 2348

## 2020-05-30 NOTE — ED Triage Notes (Signed)
Pt states that he felt like he has been skipping heartbeats and has a hx of PVCs. Also notes hypertension. Alert and oriented.

## 2020-07-03 ENCOUNTER — Telehealth: Payer: Self-pay

## 2020-07-03 DIAGNOSIS — Z006 Encounter for examination for normal comparison and control in clinical research program: Secondary | ICD-10-CM

## 2020-07-03 NOTE — Telephone Encounter (Signed)
Called patient for 90 day Identify phone call pt stated he is still having the cardiac arrhythmias and he did have to go to the ER recently to be seen. He also stated that he is following up with PCP, I reminded the patient that we would be calling him back around January for a year follow up phone call.

## 2020-07-23 NOTE — Progress Notes (Signed)
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  Patient saw Dr. Tamala Julian 02/2020 with chest pain and coronary CTA ordered.  Calcium score 61.9 which was 40th percentile minimal nonobstructive CAD in the RCA and LAD  Patient was in the ED 05/30/2020 with worsening palpitations.  EKG read out sinus tachycardia with ventricular bigeminy and was started on metoprolol 12.5 mg as needed.  Aspirin 81 mg daily added and recommended LDL less than 70.   Patient says he had just finished the treadmill and had increased the weights in his weight training earlier in the am. Has only used metoprolol a couple times. Walks 2 miles in am, 2 miles on treadmill and does weights. Denies chest pain, shortness of breath. Has occasional dizziness he thinks is related to cataracts.   Past Medical History:  Diagnosis Date   Allergy    Atypical nevus 11/28/2008   Left Sideburn (w/s)   Basal cell carcinoma 11/28/2008   Left Nose (Cx3,Exc)   Depression    Diverticulosis 10/2004   GERD (gastroesophageal reflux disease) 07/2001   GI bleeding 04/6627   History of Helicobacter pylori infection 2004   History of kidney stones 04/1989   HLD (hyperlipidemia) 12/2002   Nodular basal cell carcinoma (BCC) 05/23/2014   Right Ear Rim (Cx3,Cautery)   Ventral hernia     Past Surgical History:  Procedure Laterality Date   COLECTOMY  02/05/2010   KIDNEY STONE SURGERY     TONSILLECTOMY      Current Medications: Current Meds   Medication Sig   aspirin 81 MG tablet Take 81 mg by mouth daily. Reported on 08/01/2015   fluticasone (FLONASE) 50 MCG/ACT nasal spray SHAKE LIQUID AND USE 2 SPRAYS IN EACH NOSTRIL DAILY AS NEEDED (Patient taking differently: Place 1 spray into both nostrils daily.)   metoprolol tartrate (LOPRESSOR) 25 MG tablet Take 0.5 tablets (12.5 mg total) by mouth 2 (two) times daily as needed (palpitations/PVCs).   Multiple Vitamin (MULTIVITAMIN) tablet Take 1 tablet by mouth daily.   rosuvastatin (CRESTOR) 5 MG tablet Take 1 tablet (5 mg total) by mouth daily.     Allergies:   Patient has no known allergies.   Social History   Socioeconomic History   Marital status: Single    Spouse name: Not on file   Number of children: 0   Years of education: Not on file   Highest education level: Not on file  Occupational History   Occupation: retired Marine scientist: RETIRED  Tobacco Use   Smoking status: Former    Packs/day: 1.50    Years: 20.00    Pack years: 30.00    Types: Cigarettes    Quit date: 08/13/2000    Years since quitting: 19.9   Smokeless tobacco: Never  Vaping Use   Vaping Use: Never used  Substance and Sexual Activity   Alcohol use: Yes    Alcohol/week: 12.0 - 14.0 standard  drinks    Types: 12 - 14 Standard drinks or equivalent per week    Comment: wine with dinner 10-14 drinks    Drug use: No   Sexual activity: Not Currently  Other Topics Concern   Not on file  Social History Narrative   Single. Education:    Exercise walking 2-4 miles/week   Social Determinants of Health   Financial Resource Strain: Not on file  Food Insecurity: Not on file  Transportation Needs: Not on file  Physical Activity: Not on file  Stress: Not on file  Social Connections: Not on file     Family History:  The patient's family history includes Clotting disorder in his mother; Colon polyps in his father; Diabetes in his paternal grandfather; Heart disease in his father, maternal  grandfather, maternal grandmother, mother, paternal grandfather, paternal grandmother, and sister; Hodgkin's lymphoma in an other family member; Hyperlipidemia in his brother, father, maternal grandfather, maternal grandmother, mother, paternal grandfather, paternal grandmother, and sister; Hypertension in his father, maternal grandfather, mother, and paternal grandfather; Mental illness in his mother; Stroke in his paternal grandfather.   ROS:   Please see the history of present illness.    ROS All other systems reviewed and are negative.   PHYSICAL EXAM:   VS:  BP (!) 110/58   Pulse 86   Ht 5' 9.5" (1.765 m)   Wt 138 lb 9.6 oz (62.9 kg)   SpO2 93%   BMI 20.17 kg/m   Physical Exam  GEN: Thin, in no acute distress  Neck: no JVD, carotid bruits, or masses Cardiac:RRR with some skipping; no murmurs, rubs, or gallops  Respiratory:  clear to auscultation bilaterally, normal work of breathing GI: soft, nontender, nondistended, + BS Ext: without cyanosis, clubbing, or edema, Good distal pulses bilaterally Neuro:  Alert and Oriented x 3 Psych: euthymic mood, full affect  Wt Readings from Last 3 Encounters:  08/06/20 138 lb 9.6 oz (62.9 kg)  05/30/20 140 lb (63.5 kg)  03/04/20 143 lb 1.3 oz (64.9 kg)      Studies/Labs Reviewed:   EKG:  EKG is not ordered today.  Recent Labs: 01/26/2020: ALT 26; Magnesium 2.3 05/30/2020: BUN 28; Creatinine, Ser 0.81; Hemoglobin 14.8; Platelets 188; Potassium 3.9; Sodium 139   Lipid Panel    Component Value Date/Time   CHOL 150 08/23/2019 0848   TRIG 47 08/23/2019 0848   HDL 61 08/23/2019 0848   CHOLHDL 2.5 08/23/2019 0848   CHOLHDL 2.0 08/01/2015 0846   VLDL 13 08/01/2015 0846   LDLCALC 79 08/23/2019 0848    Additional studies/ records that were reviewed today include:  Cardiac CTA 1/24/2022Aorta:  Normal size.  Mild atherosclerosis.  No dissection.   Aortic Valve:  Trileaflet.  No calcifications.   Coronary Arteries:  Normal coronary  origin.  Right dominance.   RCA is a large dominant artery that gives rise to PDA. There is minimal (0-24%) calcified plaque in the proximal vessel.   Left main is a large artery that gives rise to LAD and LCX arteries.   LAD is a large vessel that gives rise to large branching D1 and D2 and small D3; there are 3 minimal (0-24%) calcified stenoses in the proximal to mid vessel.   LCX is a non-dominant artery that gives rise to OM1. There is no plaque.   Other findings:   Normal pulmonary vein drainage into the left atrium.   Normal let atrial appendage without a thrombus.   Normal size of the pulmonary  artery.   IMPRESSION: 1. Coronary calcium score of 61.9. This was 37 percentile for age and sex matched control.   2. Normal coronary origin with right dominance.   3. Minimal nonobstructive CAD as outlined above; CAD RADS 1.   Kirk Ruths     Electronically Signed   By: Kirk Ruths M.D.   On: 03/18/2020 12:55   Sinus treadmill test May 28, 2017 Study Highlights     Blood pressure demonstrated a normal response to exercise. No T wave inversion was noted during stress. There was no ST segment deviation noted during stress. Arrhythmias during stress: rare PVCs. Arrhythmias during recovery: rare PVCs. Ventricular couplets Blood pressure and heart rate demonstrated a normal response to exercise Overall, the patient's exercise capacity was normal. Duke Treadmill Score: low risk   Negative stress test without evidence of ischemia at given workload. PVC's and couplets noted during exercise and recovery. pril 5, 2018:   Abdominal duplex scan performed on 08/25/2017: Final Interpretation:  Abdominal Aorta: No evidence of an abdominal aortic aneurysm was  visualized. The largest aortic measurement is 1.6 cm. Velocity in the  distal aorta is in the >50% range with plaque visualized.    Monitor performed May 28, 2016: Study Highlights     Basic rhythm is normal  sinus Flutter correlates with PVCs and ventricular couplets No sustained tachycardia or bradycardia arrhythmias No atrial fibrillation   Normal sinus rhythm PVCs with occasional ventricular couplets and symptoms of flutter associated.      Risk Assessment/Calculations:         ASSESSMENT:    1. Frequent PVCs   2. Coronary artery disease involving native coronary artery of native heart without angina pectoris   3. Other hyperlipidemia   4. Family history of early CAD      PLAN:  In order of problems listed above:  Frequent PVCs with the ER visit 05/30/2020 given as needed metoprolol-rarely uses it because it makes him feel sluggish. Exercising daily.   Nonobstructive CAD with calcium score of 61 on coronary CTA 03/18/2020 on ASA and Crestor-no chest pain  Hyperlipidemia due for FLP today.  Family history of early CAD  Shared Decision Making/Informed Consent        Medication Adjustments/Labs and Tests Ordered: Current medicines are reviewed at length with the patient today.  Concerns regarding medicines are outlined above.  Medication changes, Labs and Tests ordered today are listed in the Patient Instructions below. Patient Instructions  Medication Instructions:  Your physician recommends that you continue on your current medications as directed. Please refer to the Current Medication list given to you today.  *If you need a refill on your cardiac medications before your next appointment, please call your pharmacy*   Lab Work: TODAY: FLP  If you have labs (blood work) drawn today and your tests are completely normal, you will receive your results only by: [place] (if you have MyChart) OR A paper copy in the mail If you have any lab test that is abnormal or we need to change your treatment, we will call you to review the results.   Follow-Up: At [facility], you and your health needs are our priority.  As part of our continuing mission to provide you  with exceptional heart care, we have created designated Provider Care Teams.  These Care Teams include your primary Cardiologist (physician) and Advanced Practice Providers (APPs -  Physician Assistants and Nurse Practitioners) who all work together to provide you with the care  you need, when you need it.  We recommend signing up for the patient portal called "MyChart".  Sign up information is provided on this After Visit Summary.  MyChart is used to connect with patients for Virtual Visits (Telemedicine).  Patients are able to view lab/test results, encounter notes, upcoming appointments, etc.  Non-urgent messages can be sent to your provider as well.   To learn more about what you can do with MyChart, go to [email or web address].    Your next appointment:   1 year(s)  The format for your next appointment:   In Person  Provider:   You may see Sinclair Grooms, MD or one of the following Advanced Practice Providers on your designated Care Team:   Kathyrn Drown, NP      Signed, Ermalinda Barrios, PA-C  08/06/2020 8:00 AM    [address], [place], [place]  [address] Phone: [phone]; Fax: [phone]

## 2020-08-02 ENCOUNTER — Other Ambulatory Visit: Payer: Self-pay | Admitting: Registered Nurse

## 2020-08-02 DIAGNOSIS — E7800 Pure hypercholesterolemia, unspecified: Secondary | ICD-10-CM

## 2020-08-06 ENCOUNTER — Encounter: Payer: Self-pay | Admitting: Physician Assistant

## 2020-08-06 ENCOUNTER — Other Ambulatory Visit: Payer: Self-pay

## 2020-08-06 ENCOUNTER — Ambulatory Visit (INDEPENDENT_AMBULATORY_CARE_PROVIDER_SITE_OTHER): Payer: Medicare Other | Admitting: Physician Assistant

## 2020-08-06 VITALS — BP 110/58 | HR 86 | Ht 69.5 in | Wt 138.6 lb

## 2020-08-06 DIAGNOSIS — I2510 Atherosclerotic heart disease of native coronary artery without angina pectoris: Secondary | ICD-10-CM

## 2020-08-06 DIAGNOSIS — I493 Ventricular premature depolarization: Secondary | ICD-10-CM | POA: Diagnosis not present

## 2020-08-06 DIAGNOSIS — Z8249 Family history of ischemic heart disease and other diseases of the circulatory system: Secondary | ICD-10-CM

## 2020-08-06 DIAGNOSIS — E7849 Other hyperlipidemia: Secondary | ICD-10-CM | POA: Diagnosis not present

## 2020-08-06 LAB — LIPID PANEL
Chol/HDL Ratio: 2.5 ratio (ref 0.0–5.0)
Cholesterol, Total: 148 mg/dL (ref 100–199)
HDL: 59 mg/dL (ref >39)
LDL Chol Calc (NIH): 77 mg/dL (ref 0–99)
Triglycerides: 56 mg/dL (ref 0–149)
VLDL Cholesterol Cal: 12 mg/dL (ref 5–40)

## 2020-08-06 NOTE — Patient Instructions (Signed)
Medication Instructions:  Your physician recommends that you continue on your current medications as directed. Please refer to the Current Medication list given to you today.  *If you need a refill on your cardiac medications before your next appointment, please call your pharmacy*   Lab Work: TODAY: FLP  If you have labs (blood work) drawn today and your tests are completely normal, you will receive your results only by: [place] (if you have MyChart) OR A paper copy in the mail If you have any lab test that is abnormal or we need to change your treatment, we will call you to review the results.   Follow-Up: At [facility], you and your health needs are our priority.  As part of our continuing mission to provide you with exceptional heart care, we have created designated Provider Care Teams.  These Care Teams include your primary Cardiologist (physician) and Advanced Practice Providers (APPs -  Physician Assistants and Nurse Practitioners) who all work together to provide you with the care you need, when you need it.  We recommend signing up for the patient portal called "MyChart".  Sign up information is provided on this After Visit Summary.  MyChart is used to connect with patients for Virtual Visits (Telemedicine).  Patients are able to view lab/test results, encounter notes, upcoming appointments, etc.  Non-urgent messages can be sent to your provider as well.   To learn more about what you can do with MyChart, go to [email or web address].    Your next appointment:   1 year(s)  The format for your next appointment:   In Person  Provider:   You may see Sinclair Grooms, MD or one of the following Advanced Practice Providers on your designated Care Team:   Kathyrn Drown, NP

## 2020-08-13 ENCOUNTER — Other Ambulatory Visit: Payer: Self-pay

## 2020-08-13 DIAGNOSIS — E7849 Other hyperlipidemia: Secondary | ICD-10-CM

## 2020-08-14 ENCOUNTER — Telehealth: Payer: Self-pay

## 2020-08-14 NOTE — Telephone Encounter (Signed)
Error

## 2020-08-23 ENCOUNTER — Encounter: Payer: Self-pay | Admitting: Registered Nurse

## 2020-09-10 ENCOUNTER — Encounter: Payer: Self-pay | Admitting: Registered Nurse

## 2020-09-10 ENCOUNTER — Ambulatory Visit (INDEPENDENT_AMBULATORY_CARE_PROVIDER_SITE_OTHER): Payer: Medicare Other | Admitting: Registered Nurse

## 2020-09-10 ENCOUNTER — Other Ambulatory Visit: Payer: Self-pay

## 2020-09-10 VITALS — BP 116/52 | HR 64 | Temp 98.3°F | Resp 18 | Ht 69.5 in | Wt 141.4 lb

## 2020-09-10 DIAGNOSIS — R350 Frequency of micturition: Secondary | ICD-10-CM | POA: Diagnosis not present

## 2020-09-10 DIAGNOSIS — J302 Other seasonal allergic rhinitis: Secondary | ICD-10-CM

## 2020-09-10 DIAGNOSIS — I493 Ventricular premature depolarization: Secondary | ICD-10-CM | POA: Diagnosis not present

## 2020-09-10 DIAGNOSIS — Z8639 Personal history of other endocrine, nutritional and metabolic disease: Secondary | ICD-10-CM

## 2020-09-10 DIAGNOSIS — Z0000 Encounter for general adult medical examination without abnormal findings: Secondary | ICD-10-CM

## 2020-09-10 DIAGNOSIS — E7800 Pure hypercholesterolemia, unspecified: Secondary | ICD-10-CM

## 2020-09-10 DIAGNOSIS — Z125 Encounter for screening for malignant neoplasm of prostate: Secondary | ICD-10-CM

## 2020-09-10 LAB — URINALYSIS
Bilirubin Urine: NEGATIVE
Hgb urine dipstick: NEGATIVE
Ketones, ur: NEGATIVE
Leukocytes,Ua: NEGATIVE
Nitrite: NEGATIVE
Specific Gravity, Urine: 1.01 (ref 1.000–1.030)
Total Protein, Urine: NEGATIVE
Urine Glucose: NEGATIVE
Urobilinogen, UA: 0.2 (ref 0.0–1.0)
pH: 6 (ref 5.0–8.0)

## 2020-09-10 LAB — CBC WITH DIFFERENTIAL/PLATELET
Basophils Absolute: 0 10*3/uL (ref 0.0–0.1)
Basophils Relative: 0.5 % (ref 0.0–3.0)
Eosinophils Absolute: 0 10*3/uL (ref 0.0–0.7)
Eosinophils Relative: 0.4 % (ref 0.0–5.0)
HCT: 42.4 % (ref 39.0–52.0)
Hemoglobin: 14.2 g/dL (ref 13.0–17.0)
Lymphocytes Relative: 24.2 % (ref 12.0–46.0)
Lymphs Abs: 1.2 10*3/uL (ref 0.7–4.0)
MCHC: 33.5 g/dL (ref 30.0–36.0)
MCV: 87.7 fl (ref 78.0–100.0)
Monocytes Absolute: 0.3 10*3/uL (ref 0.1–1.0)
Monocytes Relative: 6.8 % (ref 3.0–12.0)
Neutro Abs: 3.4 10*3/uL (ref 1.4–7.7)
Neutrophils Relative %: 68.1 % (ref 43.0–77.0)
Platelets: 202 10*3/uL (ref 150.0–400.0)
RBC: 4.83 Mil/uL (ref 4.22–5.81)
RDW: 13.6 % (ref 11.5–15.5)
WBC: 5 10*3/uL (ref 4.0–10.5)

## 2020-09-10 LAB — COMPREHENSIVE METABOLIC PANEL
ALT: 25 U/L (ref 0–53)
AST: 20 U/L (ref 0–37)
Albumin: 4.5 g/dL (ref 3.5–5.2)
Alkaline Phosphatase: 67 U/L (ref 39–117)
BUN: 17 mg/dL (ref 6–23)
CO2: 30 mEq/L (ref 19–32)
Calcium: 9.6 mg/dL (ref 8.4–10.5)
Chloride: 101 mEq/L (ref 96–112)
Creatinine, Ser: 0.83 mg/dL (ref 0.40–1.50)
GFR: 89.8 mL/min (ref >60.00)
Glucose, Bld: 101 mg/dL — ABNORMAL HIGH (ref 70–99)
Potassium: 4.8 mEq/L (ref 3.5–5.1)
Sodium: 138 mEq/L (ref 135–145)
Total Bilirubin: 0.7 mg/dL (ref 0.2–1.2)
Total Protein: 6.4 g/dL (ref 6.0–8.3)

## 2020-09-10 LAB — PSA, MEDICARE: PSA: 3.46 ng/ml (ref 0.10–4.00)

## 2020-09-10 LAB — TSH: TSH: 1.77 u[IU]/mL (ref 0.35–5.50)

## 2020-09-10 LAB — HEMOGLOBIN A1C: Hgb A1c MFr Bld: 5.8 % (ref 4.6–6.5)

## 2020-09-10 MED ORDER — FLUTICASONE PROPIONATE 50 MCG/ACT NA SUSP: 1.0000 | Freq: Every day | NASAL | 5 refills | Status: DC

## 2020-09-10 MED ORDER — ROSUVASTATIN CALCIUM 5 MG PO TABS
5.0000 mg | ORAL_TABLET | Freq: Every day | ORAL | 3 refills | Status: DC
Start: 2020-09-10 — End: 2021-02-07

## 2020-09-10 MED ORDER — METOPROLOL SUCCINATE ER 25 MG PO TB24: 25.0000 mg | ORAL_TABLET | Freq: Every day | ORAL | 0 refills | Status: DC

## 2020-09-10 NOTE — Progress Notes (Addendum)
A   Established Patient Office Visit  Subjective:  Patient ID: Chad Juarez, male    DOB: [birth year]  Age: 70 y.o. MRN: [id number]  CC:  Chief Complaint  Patient presents with   Annual Exam    Patient states he is here for a CPE. Patient has no other concerns.    HPI Chad Juarez presents for follow up on chronic conditions  No acute concerns  Needs refill on metoprolol for frequent PVCs. Symptoms stable. Notes incomplete control of symptoms, occasional lightheadedness. Notes cardiology had started metoprolol tartrate but stated that succinate at bedtime may provide better control without AE. He is interested in trying this.   Follows with Urology q  2y. This is an "off year" for him. Notes he has increased hydration but has much increased urination - more than what he would anticipate. No consistent symptoms of bph or other urinary symptoms at this time.  Seen by cardiology in June. Stable. Lipid panel reviewed - LDL 77, total chol 148.   Notes he needs refills on rosuvastatin and flonase. Tolerates well. No Aes.   Past Medical History:  Diagnosis Date   Allergy    Atypical nevus 11/28/2008   Left Sideburn (w/s)   Basal cell carcinoma 11/28/2008   Left Nose (Cx3,Exc)   Depression    Diverticulosis 10/2004   GERD (gastroesophageal reflux disease) 07/2001   GI bleeding 09/1854   History of Helicobacter pylori infection 2004   History of kidney stones 04/1989   HLD (hyperlipidemia) 12/2002   Nodular basal cell carcinoma (BCC) 05/23/2014   Right Ear Rim (Cx3,Cautery)   Ventral hernia     Past Surgical History:  Procedure Laterality Date   COLECTOMY  02/05/2010   KIDNEY STONE SURGERY     TONSILLECTOMY      Family History  Problem Relation Age of Onset   Colon polyps Father    Hypertension Father    Heart disease Father    Hyperlipidemia Father    Clotting disorder Mother        pulmonary embolism   Heart disease Mother    Hyperlipidemia Mother    Hypertension  Mother    Mental illness Mother    Hodgkin's lymphoma Other        nephew   Hyperlipidemia Brother    Hyperlipidemia Sister    Heart disease Sister    Diabetes Paternal Grandfather    Heart disease Paternal Grandfather    Hyperlipidemia Paternal Grandfather    Hypertension Paternal Grandfather    Stroke Paternal Grandfather    Heart disease Maternal Grandmother    Hyperlipidemia Maternal Grandmother    Heart disease Maternal Grandfather    Hyperlipidemia Maternal Grandfather    Hypertension Maternal Grandfather    Heart disease Paternal Grandmother    Hyperlipidemia Paternal Grandmother     Social History   Socioeconomic History   Marital status: Single    Spouse name: Not on file   Number of children: 0   Years of education: Not on file   Highest education level: Not on file  Occupational History   Occupation: retired Marine scientist: RETIRED  Tobacco Use   Smoking status: Former    Packs/day: 1.50    Years: 20.00    Pack years: 30.00    Types: Cigarettes    Quit date: 08/13/2000    Years since quitting: 20.0   Smokeless tobacco: Never  Vaping Use   Vaping Use: Never used  Substance and Sexual  Activity   Alcohol use: Yes    Alcohol/week: 12.0 - 14.0 standard drinks    Types: 12 - 14 Standard drinks or equivalent per week    Comment: wine with dinner 10-14 drinks    Drug use: No   Sexual activity: Not Currently  Other Topics Concern   Not on file  Social History Narrative   Single. Education:    Exercise walking 2-4 miles/week   Social Determinants of Health   Financial Resource Strain: Not on file  Food Insecurity: Not on file  Transportation Needs: Not on file  Physical Activity: Not on file  Stress: Not on file  Social Connections: Not on file  Intimate Partner Violence: Not on file    Outpatient Medications Prior to Visit  Medication Sig Dispense Refill   aspirin 81 MG tablet Take 81 mg by mouth daily. Reported on 08/01/2015      fluticasone (FLONASE) 50 MCG/ACT nasal spray SHAKE LIQUID AND USE 2 SPRAYS IN EACH NOSTRIL DAILY AS NEEDED (Patient taking differently: Place 1 spray into both nostrils daily.) 16 g 11   Multiple Vitamin (MULTIVITAMIN) tablet Take 1 tablet by mouth daily.     rosuvastatin (CRESTOR) 5 MG tablet Take 1 tablet (5 mg total) by mouth daily. 90 tablet 3   metoprolol tartrate (LOPRESSOR) 25 MG tablet Take 0.5 tablets (12.5 mg total) by mouth 2 (two) times daily as needed (palpitations/PVCs). 30 tablet 0   No facility-administered medications prior to visit.    No Known Allergies  ROS Review of Systems  Constitutional: Negative.   HENT: Negative.    Eyes: Negative.   Respiratory: Negative.    Cardiovascular:  Positive for palpitations.  Gastrointestinal: Negative.   Genitourinary:  Positive for frequency.  Musculoskeletal: Negative.   Skin: Negative.   Neurological: Negative.   Psychiatric/Behavioral: Negative.    All other systems reviewed and are negative.    Objective:    Physical Exam Vitals and nursing note reviewed.  Constitutional:      General: He is not in acute distress.    Appearance: Normal appearance. He is normal weight. He is not ill-appearing, toxic-appearing or diaphoretic.  HENT:     Head: Normocephalic and atraumatic.     Right Ear: Tympanic membrane, ear canal and external ear normal. There is no impacted cerumen.     Left Ear: Tympanic membrane, ear canal and external ear normal. There is no impacted cerumen.     Nose: Nose normal. No congestion or rhinorrhea.     Mouth/Throat:     Mouth: Mucous membranes are moist.     Pharynx: Oropharynx is clear. No oropharyngeal exudate or posterior oropharyngeal erythema.  Eyes:     General: No scleral icterus.       Right eye: No discharge.        Left eye: No discharge.     Extraocular Movements: Extraocular movements intact.     Conjunctiva/sclera: Conjunctivae normal.     Pupils: Pupils are equal, round, and  reactive to light.  Neck:     Vascular: No carotid bruit.  Cardiovascular:     Rate and Rhythm: Normal rate and regular rhythm.     Pulses: Normal pulses.     Heart sounds: Normal heart sounds. No murmur heard.   No friction rub. No gallop.  Pulmonary:     Effort: Pulmonary effort is normal. No respiratory distress.     Breath sounds: Normal breath sounds. No stridor. No wheezing, rhonchi or rales.  Chest:  Chest wall: No tenderness.  Abdominal:     General: Abdomen is flat. Bowel sounds are normal. There is no distension.     Palpations: Abdomen is soft. There is no mass.     Tenderness: There is no abdominal tenderness. There is no right CVA tenderness, left CVA tenderness, guarding or rebound.     Hernia: No hernia is present.  Musculoskeletal:        General: No swelling, tenderness, deformity or signs of injury. Normal range of motion.     Cervical back: Normal range of motion and neck supple. No rigidity or tenderness.     Right lower leg: No edema.     Left lower leg: No edema.  Lymphadenopathy:     Cervical: No cervical adenopathy.  Skin:    General: Skin is warm and dry.     Capillary Refill: Capillary refill takes less than 2 seconds.     Coloration: Skin is not jaundiced or pale.     Findings: No bruising, erythema, lesion or rash.  Neurological:     General: No focal deficit present.     Mental Status: He is alert and oriented to person, place, and time. Mental status is at baseline.     Cranial Nerves: No cranial nerve deficit.     Motor: No weakness.     Gait: Gait normal.  Psychiatric:        Mood and Affect: Mood normal.        Behavior: Behavior normal.        Thought Content: Thought content normal.        Judgment: Judgment normal.    BP (!) 116/52   Pulse 64   Temp 98.3 F (36.8 C) (Temporal)   Resp 18   Ht 5' 9.5" (1.765 m)   Wt 141 lb 6.4 oz (64.1 kg)   SpO2 99%   BMI 20.58 kg/m  Wt Readings from Last 3 Encounters:  09/10/20 141 lb 6.4 oz  (64.1 kg)  08/06/20 138 lb 9.6 oz (62.9 kg)  05/30/20 140 lb (63.5 kg)     There are no preventive care reminders to display for this patient.  There are no preventive care reminders to display for this patient.  Lab Results  Component Value Date   TSH 1.580 04/16/2016   Lab Results  Component Value Date   WBC 5.3 05/30/2020   HGB 14.8 05/30/2020   HCT 44.8 05/30/2020   MCV 90.5 05/30/2020   PLT 188 05/30/2020   Lab Results  Component Value Date   NA 139 05/30/2020   K 3.9 05/30/2020   CO2 28 05/30/2020   GLUCOSE 113 (H) 05/30/2020   BUN 28 (H) 05/30/2020   CREATININE 0.81 05/30/2020   BILITOT 0.6 01/26/2020   ALKPHOS 75 01/26/2020   AST 17 01/26/2020   ALT 26 01/26/2020   PROT 6.8 01/26/2020   ALBUMIN 4.9 (H) 01/26/2020   CALCIUM 10.1 05/30/2020   ANIONGAP 10 05/30/2020   Lab Results  Component Value Date   CHOL 148 08/06/2020   Lab Results  Component Value Date   HDL 59 08/06/2020   Lab Results  Component Value Date   LDLCALC 77 08/06/2020   Lab Results  Component Value Date   TRIG 56 08/06/2020   Lab Results  Component Value Date   CHOLHDL 2.5 08/06/2020   No results found for: HGBA1C    Assessment & Plan:   Problem List Items Addressed This Visit  Cardiovascular and Mediastinum   Frequent PVCs - Primary   Relevant Medications   metoprolol succinate (TOPROL-XL) 25 MG 24 hr tablet   Other Relevant Orders   Comprehensive metabolic panel   CBC with Differential/Platelet   TSH   Other Visit Diagnoses     History of elevated glucose       Relevant Orders   Comprehensive metabolic panel   Hemoglobin A1c   CBC with Differential/Platelet   TSH   Frequent urination       Relevant Orders   PSA, Medicare ( [place] only)   Urinalysis   TSH   Screening PSA (prostate specific antigen)       Relevant Orders   PSA, Medicare ( [place] [place] [facility] only)   TSH       Meds ordered this encounter  Medications   metoprolol  succinate (TOPROL-XL) 25 MG 24 hr tablet    Sig: Take 1 tablet (25 mg total) by mouth at bedtime.    Dispense:  90 tablet    Refill:  0    Order Specific Question:   Supervising Provider    Answer:   Carlota Raspberry, JEFFREY R [[id number]]    Follow-up: Return in about 1 year (around 09/10/2021) for CPE and labs.   PLAN Exam unremarkable Switch to metoprolol succinate 25mg  ER po qhs. Monitor effect. Frequent urination likely related to increased hydration but will check labs for other causes. Labs collected. Will follow up with the patient as warranted. Patient encouraged to call clinic with any questions, comments, or concerns.  Maximiano Coss, NP

## 2020-09-10 NOTE — Addendum Note (Signed)
Addended by: Maximiano Coss on: 09/10/2020 11:18 AM   Modules accepted: Orders

## 2020-09-10 NOTE — Patient Instructions (Addendum)
Mr. Lunt -   Doristine Devoid to see you as always.   Labs today should be back later today or tomorrow. I'll keep you in the loop on how they look  I've sent metoprolol succinate 25mg  ER to take nightly. Let me know how it goes. I'll CC cardiology to let them know about the change.   Keep up the great work with diet and exercise. I'd prefer you stay active as tolerated rather than limit activity, especially if exercise does not seem to trigger PVCs.   If labs are normal, see you in a year for annual visit - sooner if you need anything  Thank you  Rich     If you have lab work done today you will be contacted with your lab results within the next 2 weeks.  If you have not heard from [place] then please contact us. The fastest way to get your results is to register for My Chart.   IF you received an x-ray today, you will receive an invoice from [facility]. Please contact [facility] at [phone] with questions or concerns regarding your invoice.   IF you received labwork today, you will receive an invoice from [facility]. Please contact [facility] at [phone] with questions or concerns regarding your invoice.   Our billing staff will not be able to assist you with questions regarding bills from these companies.  You will be contacted with the lab results as soon as they are available. The fastest way to get your results is to activate your My Chart account. Instructions are located on the last page of this paperwork. If you have not heard from [place] regarding the results in 2 weeks, please contact this office.

## 2020-11-11 ENCOUNTER — Telehealth: Payer: Self-pay | Admitting: Registered Nurse

## 2020-11-11 NOTE — Telephone Encounter (Signed)
Visit on 09/10/2020 needs to be recoded to a wellness exam - his insurance will not pay because it was labeled a physical. -

## 2020-12-01 ENCOUNTER — Other Ambulatory Visit: Payer: Self-pay | Admitting: Registered Nurse

## 2020-12-01 DIAGNOSIS — I493 Ventricular premature depolarization: Secondary | ICD-10-CM

## 2020-12-12 ENCOUNTER — Encounter: Payer: Self-pay | Admitting: *Deleted

## 2020-12-12 ENCOUNTER — Encounter: Payer: Self-pay | Admitting: Registered Nurse

## 2020-12-12 MED ORDER — FLUTICASONE PROPIONATE 50 MCG/ACT NA SUSP: 1.0000 | Freq: Two times a day (BID) | NASAL | 5 refills | Status: DC | PRN

## 2020-12-12 NOTE — Telephone Encounter (Signed)
Have corrected this  Thanks  Denice Paradise

## 2020-12-12 NOTE — Addendum Note (Signed)
Addended by: Maximiano Coss on: 12/12/2020 10:04 AM   Modules accepted: Orders

## 2020-12-12 NOTE — Telephone Encounter (Signed)
Have corrected rx and addended visit to reflect proper coding. Should be ok now.  Thank you  Rich

## 2021-02-06 ENCOUNTER — Other Ambulatory Visit: Payer: Self-pay

## 2021-02-06 ENCOUNTER — Other Ambulatory Visit: Payer: Medicare Other

## 2021-02-06 DIAGNOSIS — E7849 Other hyperlipidemia: Secondary | ICD-10-CM

## 2021-02-06 LAB — LIPID PANEL
Chol/HDL Ratio: 2.9 ratio (ref 0.0–5.0)
Cholesterol, Total: 153 mg/dL (ref 100–199)
HDL: 52 mg/dL (ref >39)
LDL Chol Calc (NIH): 88 mg/dL (ref 0–99)
Triglycerides: 64 mg/dL (ref 0–149)
VLDL Cholesterol Cal: 13 mg/dL (ref 5–40)

## 2021-02-07 ENCOUNTER — Telehealth: Payer: Self-pay

## 2021-02-07 DIAGNOSIS — E7800 Pure hypercholesterolemia, unspecified: Secondary | ICD-10-CM

## 2021-02-07 DIAGNOSIS — I493 Ventricular premature depolarization: Secondary | ICD-10-CM

## 2021-02-07 MED ORDER — METOPROLOL SUCCINATE ER 25 MG PO TB24: ORAL_TABLET | ORAL | 1 refills | Status: DC

## 2021-02-07 MED ORDER — ROSUVASTATIN CALCIUM 10 MG PO TABS: 10.0000 mg | ORAL_TABLET | Freq: Every day | ORAL | 1 refills | Status: DC

## 2021-02-07 NOTE — Telephone Encounter (Signed)
The patient has been notified of the result and verbalized understanding.  All questions (if any) were answered. Ewell Poe [place], RN 02/07/2021 11:36 AM    Patient requested refill on metoprolol succinate and informed us that he does not take metoprolol tartrate. Updated patient's medication list. Made patient an appointment with Dr. Tamala Julian for his follow up in June and made patient an appointment for lab work in March.

## 2021-02-07 NOTE — Telephone Encounter (Signed)
-----   Message from Imogene Burn, PA-C sent at 02/07/2021  6:49 AM EST ----- LDL higher at 88, goal less than 70. Increase crestor 10 mg daily. Repeat FLP in 3 months thanks

## 2021-05-07 ENCOUNTER — Other Ambulatory Visit: Payer: Medicare Other | Admitting: *Deleted

## 2021-05-07 ENCOUNTER — Other Ambulatory Visit: Payer: Self-pay

## 2021-05-07 DIAGNOSIS — E7800 Pure hypercholesterolemia, unspecified: Secondary | ICD-10-CM

## 2021-05-07 DIAGNOSIS — I493 Ventricular premature depolarization: Secondary | ICD-10-CM

## 2021-05-07 LAB — LIPID PANEL
Chol/HDL Ratio: 2.5 ratio (ref 0.0–5.0)
Cholesterol, Total: 146 mg/dL (ref 100–199)
HDL: 59 mg/dL (ref >39)
LDL Chol Calc (NIH): 71 mg/dL (ref 0–99)
Triglycerides: 81 mg/dL (ref 0–149)
VLDL Cholesterol Cal: 16 mg/dL (ref 5–40)

## 2021-05-27 ENCOUNTER — Other Ambulatory Visit: Payer: Self-pay | Admitting: Registered Nurse

## 2021-05-27 DIAGNOSIS — J302 Other seasonal allergic rhinitis: Secondary | ICD-10-CM

## 2021-06-25 ENCOUNTER — Ambulatory Visit (INDEPENDENT_AMBULATORY_CARE_PROVIDER_SITE_OTHER): Payer: Medicare Other | Admitting: Dermatology

## 2021-06-25 DIAGNOSIS — D239 Other benign neoplasm of skin, unspecified: Secondary | ICD-10-CM

## 2021-06-25 DIAGNOSIS — L821 Other seborrheic keratosis: Secondary | ICD-10-CM

## 2021-06-25 DIAGNOSIS — Z86018 Personal history of other benign neoplasm: Secondary | ICD-10-CM

## 2021-06-25 DIAGNOSIS — Z1283 Encounter for screening for malignant neoplasm of skin: Secondary | ICD-10-CM | POA: Diagnosis not present

## 2021-06-25 DIAGNOSIS — D2361 Other benign neoplasm of skin of right upper limb, including shoulder: Secondary | ICD-10-CM

## 2021-06-25 DIAGNOSIS — L814 Other melanin hyperpigmentation: Secondary | ICD-10-CM

## 2021-06-25 DIAGNOSIS — Z85828 Personal history of other malignant neoplasm of skin: Secondary | ICD-10-CM

## 2021-07-12 ENCOUNTER — Encounter: Payer: Self-pay | Admitting: Dermatology

## 2021-07-12 NOTE — Progress Notes (Signed)
   Follow-Up Visit   Subjective  Chad Juarez is a 71 y.o. male who presents for the following: Annual Exam (Here for annual skin exam. Concerns patient has new mole left temple area. History of atypical moles and non mole skin cancers. ).  General skin examination, several areas of concern Location:  Duration:  Quality:  Associated Signs/Symptoms: Modifying Factors:  Severity:  Timing: Context:   Objective  Well appearing patient in no apparent distress; mood and affect are within normal limits. Full body skin exam.  No atypical pigmented lesions (all checked with dermoscopy), no new or recurrent nonmelanoma skin cancer  Mid Back Several 4 to 6 mm brown textured flattopped papules, compatible dermoscopy  Right Shoulder - Posterior Firm 5 mm pink dermal papule, compatible dermoscopy  Dorsal Penile Shaft 3 mm monochrome light brown symmetric macule, no dermoscopic atypia    A full examination was performed including scalp, head, eyes, ears, nose, lips, neck, chest, axillae, abdomen, back, buttocks, bilateral upper extremities, bilateral lower extremities, hands, feet, fingers, toes, fingernails, and toenails. All findings within normal limits unless otherwise noted below.   Assessment & Plan    Seborrheic keratosis Mid Back  Leave if stable  Screening for malignant neoplasm of skin  Yearly skin exam.  Dermatofibroma Right Shoulder - Posterior  Leave if stable  Lentigo simplex Dorsal Penile Shaft  Check if there is clinical change      I, Lavonna Monarch, MD, have reviewed all documentation for this visit.  The documentation on 07/12/21 for the exam, diagnosis, procedures, and orders are all accurate and complete.

## 2021-08-04 ENCOUNTER — Other Ambulatory Visit: Payer: Self-pay | Admitting: Physician Assistant

## 2021-08-04 DIAGNOSIS — I493 Ventricular premature depolarization: Secondary | ICD-10-CM

## 2021-08-04 DIAGNOSIS — E7800 Pure hypercholesterolemia, unspecified: Secondary | ICD-10-CM

## 2021-08-18 ENCOUNTER — Ambulatory Visit (INDEPENDENT_AMBULATORY_CARE_PROVIDER_SITE_OTHER): Payer: Medicare Other | Admitting: Interventional Cardiology

## 2021-08-18 ENCOUNTER — Encounter: Payer: Self-pay | Admitting: Interventional Cardiology

## 2021-08-18 VITALS — BP 118/64 | HR 61 | Ht 69.5 in | Wt 144.8 lb

## 2021-08-18 DIAGNOSIS — E7849 Other hyperlipidemia: Secondary | ICD-10-CM

## 2021-08-18 DIAGNOSIS — Z87891 Personal history of nicotine dependence: Secondary | ICD-10-CM

## 2021-08-18 DIAGNOSIS — I2510 Atherosclerotic heart disease of native coronary artery without angina pectoris: Secondary | ICD-10-CM

## 2021-08-18 DIAGNOSIS — I493 Ventricular premature depolarization: Secondary | ICD-10-CM | POA: Diagnosis not present

## 2021-09-12 ENCOUNTER — Encounter: Payer: Medicare Other | Admitting: Registered Nurse

## 2021-09-23 ENCOUNTER — Encounter: Payer: Self-pay | Admitting: Interventional Cardiology

## 2021-09-26 MED ORDER — METOPROLOL SUCCINATE ER 25 MG PO TB24: 12.5000 mg | ORAL_TABLET | Freq: Every day | ORAL | Status: DC

## 2021-11-05 ENCOUNTER — Other Ambulatory Visit: Payer: Self-pay | Admitting: Interventional Cardiology

## 2021-11-06 ENCOUNTER — Other Ambulatory Visit: Payer: Self-pay | Admitting: Interventional Cardiology

## 2021-11-06 DIAGNOSIS — E7800 Pure hypercholesterolemia, unspecified: Secondary | ICD-10-CM

## 2022-04-24 IMAGING — CT CT HEART MORP W/ CTA COR W/ SCORE W/ CA W/CM &/OR W/O CM
4 of 7 series · 8 of 20 positions shown, 9 images · IV contrast (APPLIED)
Comparison: None.
COMPARISON: None.

Addendum:
EXAM:
OVER-READ INTERPRETATION  CT CHEST

The following report is an over-read performed by radiologist Dr.
Myeong Heom Madiantoro [REDACTED] on 03/18/2020. This
over-read does not include interpretation of cardiac or coronary
anatomy or pathology. The coronary CTA interpretation by the
cardiologist is attached.
CLINICAL DATA: 69 yo male with chest pain
Cardiac/Coronary  CT
TECHNIQUE: The patient was scanned on a Phillips Force scanner.

[Series 6: best diast 75 % · axial · 0.35mm/px · z∈[+1170,+1218]mm · 2 of 361 slices shown]
[im 121/361  vessel]
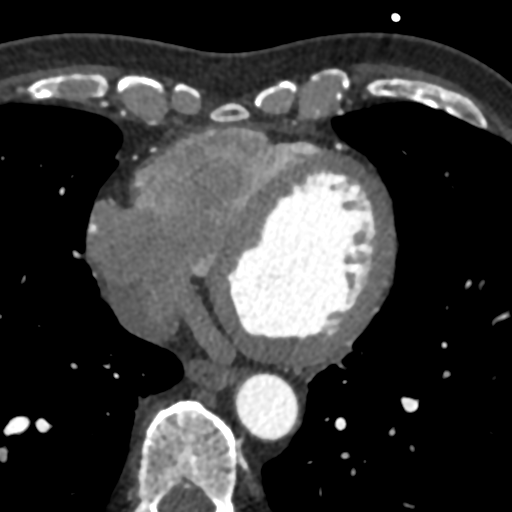
[im 241/361  vessel]
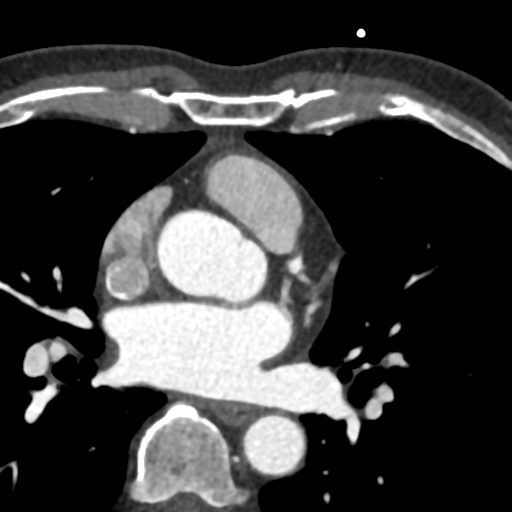

[Series 7: best syst · axial · 0.35mm/px · z∈[+1170,+1218]mm · 2 of 361 slices shown, 3 images]
[im 121/361  vessel]
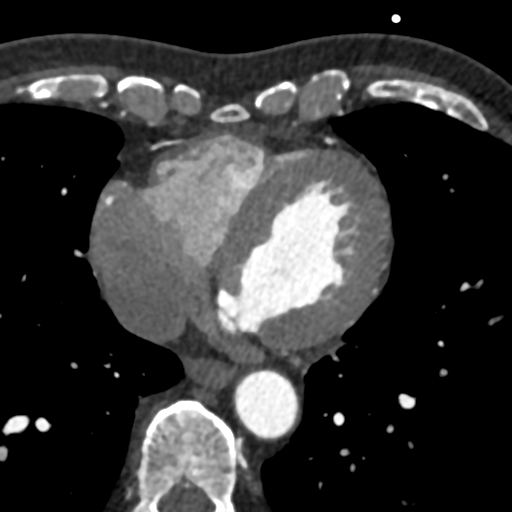
[im 121/361  lung]
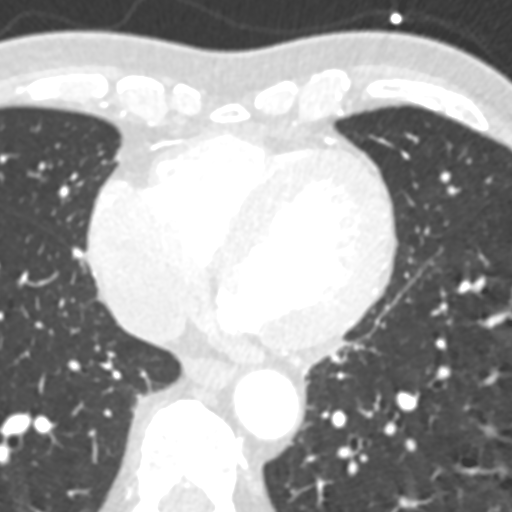
[im 241/361  vessel]
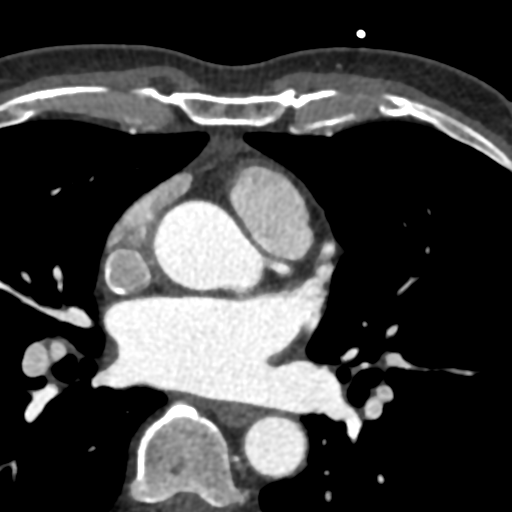

[Series 8: ts diast sharp 75 % · axial · 0.35mm/px · z∈[+1170,+1218]mm · 2 of 361 slices shown]
[im 121/361  lung]
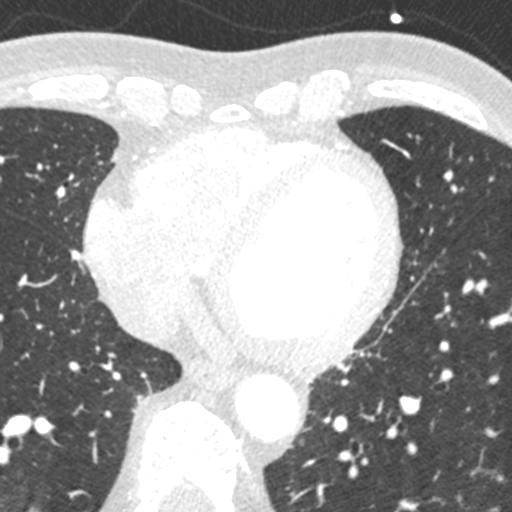
[im 241/361  lung]
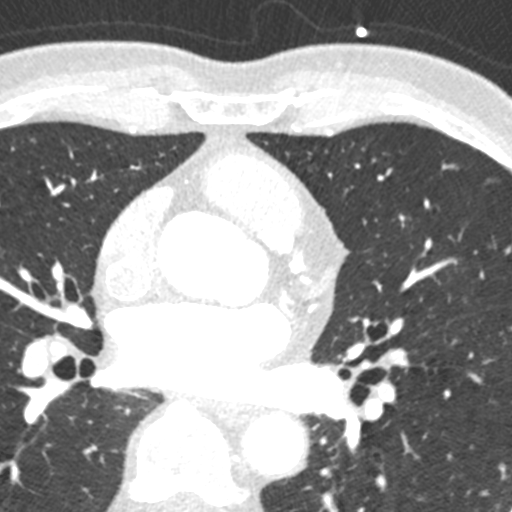

[Series 9: ts syst sharp · axial · 0.35mm/px · z∈[+1170,+1218]mm · 2 of 361 slices shown]
[im 121/361  lung]
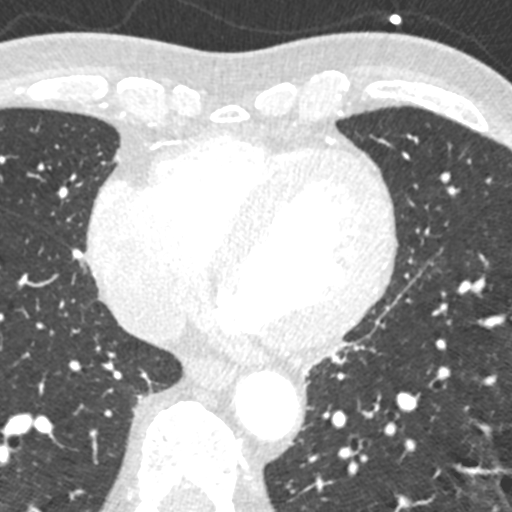
[im 241/361  lung]
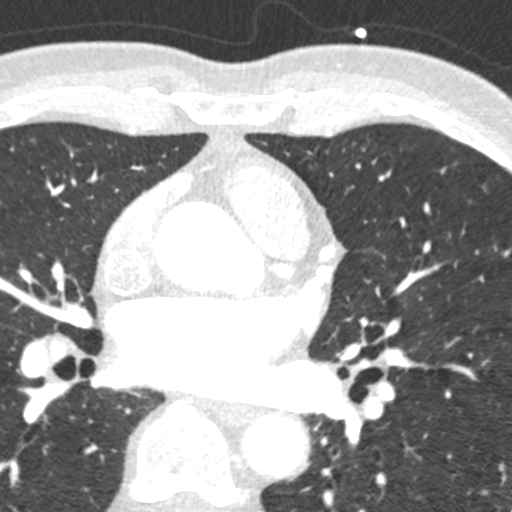

[8 of 20 positions shown; findings below may reference images not displayed]

FINDINGS: Limited view of the lung parenchyma demonstrates no suspicious
nodularity. Airways are normal.

Limited view of the mediastinum demonstrates no adenopathy.
Esophagus normal.

Limited view of the upper abdomen unremarkable.

Limited view of the skeleton and chest wall is unremarkable.
IMPRESSION: No significant extracardiac findings.
FINDINGS: A 120 kV prospective scan was triggered in the descending thoracic
aorta at 111 HU's. Axial non-contrast 3 mm slices were carried out
through the heart. The data set was analyzed on a dedicated work
station and scored using the Agatson method. Gantry rotation speed
was 250 msecs and collimation was .6 mm. No beta blockade and 0.8 mg
of sl NTG was given. The 3D data set was reconstructed in 5%
intervals of the 67-82 % of the R-R cycle. Diastolic phases were
analyzed on a dedicated work station using MPR, MIP and VRT modes.
The patient received 80 cc of contrast.

Aorta:  Normal size.  Mild atherosclerosis.  No dissection.

Aortic Valve:  Trileaflet.  No calcifications.

Coronary Arteries:  Normal coronary origin.  Right dominance.

RCA is a large dominant artery that gives rise to PDA. There is
minimal (0-24%) calcified plaque in the proximal vessel.

Left main is a large artery that gives rise to LAD and LCX arteries.

LAD is a large vessel that gives rise to large branching D1 and D2
and small D3; there are 3 minimal (0-24%) calcified stenoses in the
proximal to mid vessel.

LCX is a non-dominant artery that gives rise to OM1. There is no
plaque.

Other findings:

Normal pulmonary vein drainage into the left atrium.

Normal let atrial appendage without a thrombus.

Normal size of the pulmonary artery.
IMPRESSION: 1. Coronary calcium score of 61.9. This was 40 percentile for age
and sex matched control.

2. Normal coronary origin with right dominance.

3. Minimal nonobstructive CAD as outlined above; CAD RADS 1.

Gui Xiong Misonne

*** End of Addendum ***
EXAM:
OVER-READ INTERPRETATION  CT CHEST

The following report is an over-read performed by radiologist Dr.
Myeong Heom Madiantoro [REDACTED] on 03/18/2020. This
over-read does not include interpretation of cardiac or coronary
anatomy or pathology. The coronary CTA interpretation by the
cardiologist is attached.
FINDINGS: Limited view of the lung parenchyma demonstrates no suspicious
nodularity. Airways are normal.

Limited view of the mediastinum demonstrates no adenopathy.
Esophagus normal.

Limited view of the upper abdomen unremarkable.

Limited view of the skeleton and chest wall is unremarkable.
IMPRESSION: No significant extracardiac findings.

## 2022-06-29 ENCOUNTER — Ambulatory Visit: Payer: Medicare Other | Admitting: Dermatology

## 2022-08-13 ENCOUNTER — Other Ambulatory Visit: Payer: Self-pay

## 2022-08-13 DIAGNOSIS — E7800 Pure hypercholesterolemia, unspecified: Secondary | ICD-10-CM

## 2022-08-13 MED ORDER — METOPROLOL SUCCINATE ER 25 MG PO TB24: 12.5000 mg | ORAL_TABLET | Freq: Every day | ORAL | 0 refills | Status: DC

## 2022-08-13 MED ORDER — ROSUVASTATIN CALCIUM 10 MG PO TABS
ORAL_TABLET | ORAL | 0 refills | Status: DC
Start: 2022-08-13 — End: 2022-08-31

## 2022-08-13 NOTE — Addendum Note (Signed)
Addended by: Margaret Pyle D on: 08/13/2022 12:41 PM   Modules accepted: Orders

## 2022-08-27 NOTE — Progress Notes (Signed)
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 He is a former patient of Dr Verdis Prime. He has a hx of palpitations, PVCs, HLD, depression, prior heavy smoker stopped in 2002 family history of CAD, Cor CTA 02/2020 calcium score 61.9 & minimal nonobstructive CAD in the RCA and LAD. PVCs managed with low dose metoprolol.   On follow up today he feels well. No chest pain, dyspnea, or palpitations. He exercises regularly and does not eat red meat.      Past Medical History:  Diagnosis Date   Allergy    Atypical nevus 11/28/2008   Left Sideburn (w/s)   Basal cell carcinoma 11/28/2008   Left Nose (Cx3,Exc)   Depression    Diverticulosis 10/2004   GERD (gastroesophageal reflux disease) 07/2001   GI bleeding 09/2004   History of Helicobacter pylori infection 2004   History of kidney stones 04/1989   HLD (hyperlipidemia) 12/2002   Nodular basal cell carcinoma (BCC) 05/23/2014   Right Ear Rim (Cx3,Cautery)   Ventral hernia     Past Surgical History:  Procedure Laterality Date   COLECTOMY  02/05/2010   KIDNEY STONE SURGERY     TONSILLECTOMY      Current Medications: Current Meds  Medication Sig   aspirin 81 MG tablet Take 81 mg by mouth daily. Reported on 08/01/2015   fluticasone (FLONASE) 50 MCG/ACT nasal spray SHAKE LIQUID AND USE 1 SPRAY IN EACH NOSTRIL TWICE DAILY AS NEEDED FOR ALLERGIES OR RHINITIS   Multiple Vitamin (MULTIVITAMIN) tablet Take 1 tablet by mouth daily.   PREVIDENT 5000 SENSITIVE 1.1-5 % GEL Place onto teeth at bedtime.   [DISCONTINUED] metoprolol succinate (TOPROL-XL) 25 MG 24 hr tablet Take 0.5 tablets (12.5 mg total) by mouth daily.    [DISCONTINUED] rosuvastatin (CRESTOR) 10 MG tablet TAKE 1 TABLET(10 MG) BY MOUTH DAILY     Allergies:   Patient has no known allergies.   Social History   Socioeconomic History   Marital status: Single    Spouse name: Not on file   Number of children: 0   Years of education: Not on file   Highest education level: Not on file  Occupational History   Occupation: retired Printmaker: RETIRED  Tobacco Use   Smoking status: Former    Packs/day: 1.50    Years: 20.00    Additional pack years: 0.00    Total pack years: 30.00    Types: Cigarettes    Quit date: 08/13/2000    Years since quitting: 22.0   Smokeless tobacco: Never  Vaping Use   Vaping Use: Never used  Substance and Sexual Activity   Alcohol use: Yes    Alcohol/week: 12.0 - 14.0 standard drinks of alcohol    Types: 12 - 14 Standard drinks or equivalent per week    Comment: wine with dinner 10-14 drinks    Drug use: No   Sexual activity: Not Currently  Other Topics Concern   Not on file  Social History Narrative   Single. Education:    Exercise walking 2-4 miles/week   Social Determinants of  Health   Financial Resource Strain: Low Risk  (08/22/2018)   Overall Financial Resource Strain (CARDIA)    Difficulty of Paying Living Expenses: Not hard at all  Food Insecurity: No Food Insecurity (08/22/2018)   Hunger Vital Sign    Worried About Running Out of Food in the Last Year: Never true    Ran Out of Food in the Last Year: Never true  Transportation Needs: No Transportation Needs (08/22/2018)   PRAPARE - Administrator, Civil Service (Medical): No    Lack of Transportation (Non-Medical): No  Physical Activity: Sufficiently Active (08/22/2018)   Exercise Vital Sign    Days of Exercise per Week: 5 days    Minutes of Exercise per Session: 60 min  Stress: No Stress Concern Present (08/22/2018)   [employer] of Occupational Health - Occupational Stress Questionnaire    Feeling of Stress :  Only a little  Social Connections: Unknown (08/22/2018)   Social Connection and Isolation Panel [NHANES]    Frequency of Communication with Friends and Family: Three times a week    Frequency of Social Gatherings with Friends and Family: Twice a week    Attends Religious Services: Patient declined    Database administrator or Organizations: Patient declined    Attends Engineer, structural: Patient declined    Marital Status: Never married     Family History: The patient's family history includes Clotting disorder in his mother; Colon polyps in his father; Diabetes in his paternal grandfather; Heart disease in his father, maternal grandfather, maternal grandmother, mother, paternal grandfather, paternal grandmother, and sister; Hodgkin's lymphoma in an other family member; Hyperlipidemia in his brother, father, maternal grandfather, maternal grandmother, mother, paternal grandfather, paternal grandmother, and sister; Hypertension in his father, maternal grandfather, mother, and paternal grandfather; Mental illness in his mother; Stroke in his paternal grandfather.  ROS:   Please see the history of present illness.    Change primary care physicians.  Now sees Dr. Ralene Ok.  All other systems reviewed and are negative.  EKGs/Labs/Other Studies Reviewed:    The following studies were reviewed today:  2 D Doppler ECHOCARDIOGRAM 03/20/2020 IMPRESSIONS   1. Left ventricular ejection fraction, by estimation, is 55 to 60%. The  left ventricle has normal function. The left ventricle has no regional  wall motion abnormalities. Left ventricular diastolic parameters were  normal.   2. Right ventricular systolic function is normal. The right ventricular  size is normal.   3. The mitral valve is normal in structure. Trivial mitral valve  regurgitation.   4. The aortic valve is tricuspid. Aortic valve regurgitation is not  visualized.   5. The inferior vena cava is normal in size with  greater than 50%  respiratory variability, suggesting right atrial pressure of 3 mmHg.   Comparison(s): No prior Echocardiogram.     Coronary CTA March 18, 2020: IMPRESSION: 1. Coronary calcium score of 61.9. This was 40 percentile for age and sex matched control.   2. Normal coronary origin with right dominance.   3. Minimal nonobstructive CAD as outlined above; CAD RADS 1.   Olga Millers   EKG Interpretation Date/Time:  Monday August 31 2022 15:09:20 EDT Ventricular Rate:  67 PR Interval:  154 QRS Duration:  88 QT Interval:  386 QTC Calculation: 407 R Axis:   63  Text Interpretation: Normal sinus rhythm Normal ECG When compared with ECG of 30-May-2020 17:49, PVCs resolved. Confirmed by [place], Heston Widener [phone]) on 08/31/2022 3:11:50 PM  Recent Labs: No results found for requested labs within last 365 days.  Recent Lipid Panel    Component Value Date/Time   CHOL 146 05/07/2021 0721   TRIG 81 05/07/2021 0721   HDL 59 05/07/2021 0721   CHOLHDL 2.5 05/07/2021 0721   CHOLHDL 2.0 08/01/2015 0846   VLDL 13 08/01/2015 0846   LDLCALC 71 05/07/2021 0721    Physical Exam:    VS:  BP 124/60 (BP Location: Right Arm, Patient Position: Sitting, Cuff Size: Normal)   Pulse 67   Ht 5' 9.5" (1.765 m)   Wt 147 lb 9.6 oz (67 kg)   SpO2 97%   BMI 21.48 kg/m     Wt Readings from Last 3 Encounters:  08/31/22 147 lb 9.6 oz (67 kg)  08/18/21 144 lb 12.8 oz (65.7 kg)  09/10/20 141 lb 6.4 oz (64.1 kg)     GEN: Healthy appearing. No acute distress HEENT: Normal NECK: No JVD. LYMPHATICS: No lymphadenopathy CARDIAC: No murmur. RRR no gallop, or edema. VASCULAR:  Normal Pulses. No bruits. RESPIRATORY:  Clear to auscultation without rales, wheezing or rhonchi  ABDOMEN: Soft, non-tender, non-distended, No pulsatile mass, MUSCULOSKELETAL: No deformity  SKIN: Warm and dry NEUROLOGIC:  Alert and oriented x 3 PSYCHIATRIC:  Normal affect   ASSESSMENT:    1. Coronary artery  disease involving native coronary artery of native heart without angina pectoris   2. Other hyperlipidemia   3. Frequent PVCs   4. Pure hypercholesterolemia     PLAN:    In order of problems listed above:  Asymptomatic.  Continue primary prevention with statin and ASA Continue rosuvastatin.  Most recent LDL cholesterol was 71. Control with low-dose beta-blocker therapy.  Had fatigue on higher dose of metoprolol - improved on low dose.    Medication Adjustments/Labs and Tests Ordered: Current medicines are reviewed at length with the patient today.  Concerns regarding medicines are outlined above.  Orders Placed This Encounter  Procedures   EKG 12-Lead   Meds ordered this encounter  Medications   rosuvastatin (CRESTOR) 10 MG tablet    Sig: TAKE 1 TABLET(10 MG) BY MOUTH DAILY    Dispense:  90 tablet    Refill:  3    Pt must keep upcoming appt in July 2024 with new Cardiologist Dr. [place] before anymore refills. Thank you Final Attempt   metoprolol succinate (TOPROL-XL) 25 MG 24 hr tablet    Sig: Take 0.5 tablets (12.5 mg total) by mouth daily.    Dispense:  45 tablet    Refill:  3    Pt must keep upcoming appt in July 2024 with new Cardiologist Dr. [place] before anymore refills. Thank you Final Attempt    There are no Patient Instructions on file for this visit.   Signed, Jasmin Trumbull [place], MD  08/31/2022 3:26 PM    [facility]

## 2022-08-31 ENCOUNTER — Encounter: Payer: Self-pay | Admitting: Cardiology

## 2022-08-31 ENCOUNTER — Ambulatory Visit: Payer: Medicare Other | Attending: Cardiology | Admitting: Cardiology

## 2022-08-31 VITALS — BP 124/60 | HR 67 | Ht 69.5 in | Wt 147.6 lb

## 2022-08-31 DIAGNOSIS — E7800 Pure hypercholesterolemia, unspecified: Secondary | ICD-10-CM | POA: Insufficient documentation

## 2022-08-31 DIAGNOSIS — I493 Ventricular premature depolarization: Secondary | ICD-10-CM | POA: Diagnosis present

## 2022-08-31 DIAGNOSIS — I2510 Atherosclerotic heart disease of native coronary artery without angina pectoris: Secondary | ICD-10-CM | POA: Insufficient documentation

## 2022-08-31 DIAGNOSIS — E7849 Other hyperlipidemia: Secondary | ICD-10-CM | POA: Diagnosis present

## 2022-08-31 MED ORDER — ROSUVASTATIN CALCIUM 10 MG PO TABS: ORAL_TABLET | ORAL | 3 refills | Status: DC

## 2022-08-31 MED ORDER — METOPROLOL SUCCINATE ER 25 MG PO TB24: 12.5000 mg | ORAL_TABLET | Freq: Every day | ORAL | 3 refills | Status: DC

## 2022-08-31 NOTE — Patient Instructions (Signed)
Medication Instructions:  Continue same medications *If you need a refill on your cardiac medications before your next appointment, please call your pharmacy*   Lab Work: None ordered   Testing/Procedures: None ordered   Follow-Up: At [facility], you and your health needs are our priority.  As part of our continuing mission to provide you with exceptional heart care, we have created designated Provider Care Teams.  These Care Teams include your primary Cardiologist (physician) and Advanced Practice Providers (APPs -  Physician Assistants and Nurse Practitioners) who all work together to provide you with the care you need, when you need it.  We recommend signing up for the patient portal called "MyChart".  Sign up information is provided on this After Visit Summary.  MyChart is used to connect with patients for Virtual Visits (Telemedicine).  Patients are able to view lab/test results, encounter notes, upcoming appointments, etc.  Non-urgent messages can be sent to your provider as well.   To learn more about what you can do with MyChart, go to [email or web address].    Your next appointment:  1 year   Call in March to schedule July appointment     Provider:  Dr.Jordan   

## 2022-11-13 ENCOUNTER — Encounter: Payer: Self-pay | Admitting: Gastroenterology

## 2023-02-10 ENCOUNTER — Encounter: Payer: Self-pay | Admitting: Gastroenterology

## 2023-02-10 ENCOUNTER — Other Ambulatory Visit (INDEPENDENT_AMBULATORY_CARE_PROVIDER_SITE_OTHER): Payer: Medicare Other

## 2023-02-10 ENCOUNTER — Ambulatory Visit: Payer: Medicare Other | Admitting: Gastroenterology

## 2023-02-10 VITALS — BP 128/66 | HR 83 | Ht 69.0 in | Wt 149.8 lb

## 2023-02-10 DIAGNOSIS — R1032 Left lower quadrant pain: Secondary | ICD-10-CM

## 2023-02-10 DIAGNOSIS — Z8719 Personal history of other diseases of the digestive system: Secondary | ICD-10-CM | POA: Diagnosis not present

## 2023-02-10 DIAGNOSIS — Z1211 Encounter for screening for malignant neoplasm of colon: Secondary | ICD-10-CM

## 2023-02-10 DIAGNOSIS — Z1212 Encounter for screening for malignant neoplasm of rectum: Secondary | ICD-10-CM

## 2023-02-10 LAB — CBC WITH DIFFERENTIAL/PLATELET
Basophils Absolute: 0 10*3/uL (ref 0.0–0.1)
Basophils Relative: 0.5 % (ref 0.0–3.0)
Eosinophils Absolute: 0 10*3/uL (ref 0.0–0.7)
Eosinophils Relative: 0.5 % (ref 0.0–5.0)
HCT: 43.6 % (ref 39.0–52.0)
Hemoglobin: 14.5 g/dL (ref 13.0–17.0)
Lymphocytes Relative: 16.6 % (ref 12.0–46.0)
Lymphs Abs: 1.2 10*3/uL (ref 0.7–4.0)
MCHC: 33.2 g/dL (ref 30.0–36.0)
MCV: 88.4 fL (ref 78.0–100.0)
Monocytes Absolute: 0.5 10*3/uL (ref 0.1–1.0)
Monocytes Relative: 6.6 % (ref 3.0–12.0)
Neutro Abs: 5.4 10*3/uL (ref 1.4–7.7)
Neutrophils Relative %: 75.8 % (ref 43.0–77.0)
Platelets: 199 10*3/uL (ref 150.0–400.0)
RBC: 4.94 Mil/uL (ref 4.22–5.81)
RDW: 13.3 % (ref 11.5–15.5)
WBC: 7.1 10*3/uL (ref 4.0–10.5)

## 2023-02-10 LAB — COMPREHENSIVE METABOLIC PANEL
ALT: 39 U/L (ref 0–53)
AST: 26 U/L (ref 0–37)
Albumin: 4.5 g/dL (ref 3.5–5.2)
Alkaline Phosphatase: 74 U/L (ref 39–117)
BUN: 21 mg/dL (ref 6–23)
CO2: 31 meq/L (ref 19–32)
Calcium: 9.7 mg/dL (ref 8.4–10.5)
Chloride: 101 meq/L (ref 96–112)
Creatinine, Ser: 0.79 mg/dL (ref 0.40–1.50)
GFR: 89.61 mL/min (ref >60.00)
Glucose, Bld: 110 mg/dL — ABNORMAL HIGH (ref 70–99)
Potassium: 4.4 meq/L (ref 3.5–5.1)
Sodium: 138 meq/L (ref 135–145)
Total Bilirubin: 0.5 mg/dL (ref 0.2–1.2)
Total Protein: 6.8 g/dL (ref 6.0–8.3)

## 2023-02-10 NOTE — Progress Notes (Signed)
Chief Complaint: GI problems  Referring Provider:  Ralene Ok, MD      ASSESSMENT AND PLAN;   #1. CRC screening  #2. LLQ pain- R/O diverticulitis.  #3. H/O duplication cyst s/p sigmoid partial colectomy 2011  Plan: -CBC, CMP -CT AP with contrast -Colon with MiraLAX   Discussed risks & benefits of colonoscopy. Risks including rare perforation req laparotomy, bleeding after bx/polypectomy req blood transfusion, rarely missing neoplasms, risks of anesthesia/sedation, rare risk of damage to internal organs. Benefits outweigh the risks. Patient agrees to proceed. All the questions were answered. Pt consents to proceed. HPI:    Chad Juarez is a 72 y.o. 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  History of Present Illness   The patient, with a history of diverticulosis and a duplication cyst of the sigmoid colon, underwent a sigmoid colectomy in 2011. They have had two colonoscopies, one before and one after the surgery, both of which showed diverticulosis and internal hemorrhoids. The patient has been advised to have another colonoscopy due to the passage of more than ten years since the last one.  Recently, the patient experienced an episode of constipation about a month ago, which required significant straining. Since then, they have noticed some tenderness in the left lower abdomen. The patient managed the constipation with Metamucil for three days and has not had any problems since. They also consume a Granny Smith apple every night and drink plenty of water.  No fever or chills.     No nausea, vomiting, heartburn, regurgitation, odynophagia or dysphagia.  No significant diarrhea or constipation (better with metamucil)).  No melena or hematochezia. No unintentional weight loss.     Previous colonoscopy-Dr. Kinnie Scales Colon 11/2009 -proab giant sig div -Mild div -anastomasis @ 18 cm  EGD: nl 08/2011  Past Medical History:  Diagnosis Date   Allergy    Atypical nevus 11/28/2008   Left Sideburn (w/s)   Basal cell carcinoma 11/28/2008   Left Nose (Cx3,Exc)   Depression    Diverticulosis 10/2004   GERD (gastroesophageal reflux disease) 07/2001   GI bleeding 09/2004   History of Helicobacter pylori infection 2004   History of kidney stones 04/1989   HLD (hyperlipidemia) 12/2002   Nodular basal cell carcinoma (BCC) 05/23/2014   Right Ear Rim (Cx3,Cautery)   Ventral hernia     Past Surgical History:  Procedure Laterality Date   COLECTOMY  02/05/2010   KIDNEY STONE SURGERY     TONSILLECTOMY      Family History  Problem Relation Age of Onset   Colon polyps Father    Hypertension Father    Heart disease Father    Hyperlipidemia Father    Clotting disorder Mother        pulmonary embolism   Heart disease Mother    Hyperlipidemia Mother    Hypertension Mother    Mental illness Mother    Hodgkin's lymphoma Other        nephew   Hyperlipidemia Brother    Hyperlipidemia Sister    Heart disease Sister    Diabetes Paternal Grandfather    Heart disease Paternal Grandfather    Hyperlipidemia Paternal Grandfather    Hypertension Paternal Grandfather    Stroke Paternal Grandfather    Heart disease Maternal Grandmother    Hyperlipidemia Maternal Grandmother  Heart disease Maternal Grandfather    Hyperlipidemia Maternal Grandfather    Hypertension Maternal Grandfather    Heart disease Paternal Grandmother    Hyperlipidemia Paternal Grandmother     Social History   Tobacco Use   Smoking status: Former    Current packs/day: 0.00    Average packs/day: 1.5 packs/day for 20.0 years (30.0 ttl pk-yrs)    Types: Cigarettes    Start date: 08/13/1980    Quit date: 08/13/2000    Years since quitting: 22.5   Smokeless tobacco: Never  Vaping Use   Vaping  status: Never Used  Substance Use Topics   Alcohol use: Yes    Alcohol/week: 12.0 - 14.0 standard drinks of alcohol    Types: 12 - 14 Standard drinks or equivalent per week    Comment: wine with dinner 10-14 drinks    Drug use: No    Current Outpatient Medications  Medication Sig Dispense Refill   aspirin 81 MG tablet Take 81 mg by mouth daily. Reported on 08/01/2015     fluticasone (FLONASE) 50 MCG/ACT nasal spray SHAKE LIQUID AND USE 1 SPRAY IN EACH NOSTRIL TWICE DAILY AS NEEDED FOR ALLERGIES OR RHINITIS 16 g 5   metoprolol succinate (TOPROL-XL) 25 MG 24 hr tablet Take 0.5 tablets (12.5 mg total) by mouth daily. 45 tablet 3   Multiple Vitamin (MULTIVITAMIN) tablet Take 1 tablet by mouth daily.     PREVIDENT 5000 SENSITIVE 1.1-5 % GEL Place onto teeth at bedtime.     rosuvastatin (CRESTOR) 10 MG tablet TAKE 1 TABLET(10 MG) BY MOUTH DAILY 90 tablet 3   No current facility-administered medications for this visit.    No Known Allergies  Review of Systems:  Constitutional: Denies fever, chills, diaphoresis, appetite change and fatigue.  HEENT: Denies photophobia, eye pain, redness, hearing loss, ear pain, congestion, sore throat, rhinorrhea, sneezing, mouth sores, neck pain, neck stiffness and tinnitus.   Respiratory: Denies SOB, DOE, cough, chest tightness,  and wheezing.   Cardiovascular: Denies chest pain, palpitations and leg swelling.  Genitourinary: Denies dysuria, urgency, frequency, hematuria, flank pain and difficulty urinating.  Musculoskeletal: Denies myalgias, back pain, joint swelling, arthralgias and gait problem.  Skin: No rash.  Neurological: Denies dizziness, seizures, syncope, weakness, light-headedness, numbness and headaches.  Hematological: Denies adenopathy. Easy bruising, personal or family bleeding history  Psychiatric/Behavioral: No anxiety or has depression     Physical Exam:    BP 128/66   Pulse 83   Ht 5\' 9"  (1.753 m)   Wt 149 lb 12.8 oz (67.9 kg)    SpO2 99%   BMI 22.12 kg/m  Wt Readings from Last 3 Encounters:  02/10/23 149 lb 12.8 oz (67.9 kg)  08/31/22 147 lb 9.6 oz (67 kg)  08/18/21 144 lb 12.8 oz (65.7 kg)   Constitutional:  Well-developed, in no acute distress. Psychiatric: Normal mood and affect. Behavior is normal. HEENT: Pupils normal.  Conjunctivae are normal. No scleral icterus. Neck supple.  Cardiovascular: Normal rate, regular rhythm. No edema Pulmonary/chest: Effort normal and breath sounds normal. No wheezing, rales or rhonchi. Abdominal: Soft, nondistended. LLQ mild abdominal tenderness. Bowel sounds active throughout. There are no masses palpable. No hepatomegaly.  Small incisional hernias. Rectal: Deferred Neurological: Alert and oriented to person place and time. Skin: Skin is warm and dry. No rashes noted.  Data Reviewed: I have personally reviewed following labs and imaging studies  CBC:    Latest Ref Rng & Units 09/10/2020    9:40 AM 05/30/2020  6:06 PM 01/26/2020    9:57 AM  CBC  WBC 4.0 - 10.5 K/uL 5.0  5.3  5.9   Hemoglobin 13.0 - 17.0 g/dL 16.1  09.6  04.5   Hematocrit 39.0 - 52.0 % 42.4  44.8  45.3   Platelets 150.0 - 400.0 K/uL 202.0  188  217     CMP:    Latest Ref Rng & Units 09/10/2020    9:40 AM 05/30/2020    6:06 PM 03/04/2020    3:14 PM  CMP  Glucose 70 - 99 mg/dL 409  811  89   BUN 6 - 23 mg/dL 17  28  21    Creatinine 0.40 - 1.50 mg/dL 9.14  7.82  9.56   Sodium 135 - 145 mEq/L 138  139  142   Potassium 3.5 - 5.1 mEq/L 4.8  3.9  4.6   Chloride 96 - 112 mEq/L 101  101  102   CO2 19 - 32 mEq/L 30  28  27    Calcium 8.4 - 10.5 mg/dL 9.6  21.3  08.6   Total Protein 6.0 - 8.3 g/dL 6.4     Total Bilirubin 0.2 - 1.2 mg/dL 0.7     Alkaline Phos 39 - 117 U/L 67     AST 0 - 37 U/L 20     ALT 0 - 53 U/L 25           Edman Circle, MD 02/10/2023, 9:12 AM  Cc: Ralene Ok, MD

## 2023-02-10 NOTE — Patient Instructions (Addendum)
_______________________________________________________  If your blood pressure at your visit was 140/90 or greater, please contact your primary care physician to follow up on this.  _______________________________________________________  If you are age 71 or older, your body mass index should be between 23-30. Your Body mass index is 22.12 kg/m. If this is out of the aforementioned range listed, please consider follow up with your Primary Care Provider.  If you are age 83 or younger, your body mass index should be between 19-25. Your Body mass index is 22.12 kg/m. If this is out of the aformentioned range listed, please consider follow up with your Primary Care Provider.   ________________________________________________________  The [facility] providers would like to encourage you to use [facility] to communicate with providers for non-urgent requests or questions.  Due to long hold times on the telephone, sending your provider a message by [facility] may be a faster and more efficient way to get a response.  Please allow 48 business hours for a response.  Please remember that this is for non-urgent requests.  _______________________________________________________  Your provider has requested that you go to the basement level for lab work before leaving today. Press "B" on the elevator. The lab is located at the first door on the left as you exit the elevator.  It has been recommended to you by your physician that you have a colonoscopy completed.  Please contact our office at [phone] the end of December or January. You will be scheduled for a pre-visit and procedure at that time.  You have been scheduled for a CT scan of the abdomen and pelvis at [facility] ([address] [place], [place], [place] [address]).   You are scheduled on 03-02-2023 at 230pm. You should arrive by 1215pm your appointment time for registration. Please follow the written instructions below on the day of your  exam:  WARNING: IF YOU ARE ALLERGIC TO IODINE/X-RAY DYE, PLEASE NOTIFY RADIOLOGY IMMEDIATELY AT [phone]! YOU WILL BE GIVEN A 13 HOUR PREMEDICATION PREP.  1) Do not eat or drink anything after 1030am (4 hours prior to your test) 2) You will be given 2 bottles of oral contrast to drink on site.    Drink 1 bottle of contrast @ 1230pm (2 hours prior to your exam)  Drink 1 bottle of contrast @ 130pm (1 hour prior to your exam)  You may take any medications as prescribed with a small amount of water, if necessary. If you take any of the following medications: METFORMIN, GLUCOPHAGE, GLUCOVANCE, AVANDAMET, RIOMET, FORTAMET, ACTOPLUS MET, JANUMET, GLUMETZA or METAGLIP, you MAY be asked to HOLD this medication 48 hours AFTER the exam.  The purpose of you drinking the oral contrast is to aid in the visualization of your intestinal tract. The contrast solution may cause some diarrhea. Depending on your individual set of symptoms, you may also receive an intravenous injection of x-ray contrast/dye. Plan on being at [facility] for 30 minutes or longer, depending on the type of exam you are having performed.  This test typically takes 30-45 minutes to complete.  If you have any questions regarding your exam or if you need to reschedule, you may call the CT department at [phone] between the hours of 8:00 am and 5:00 pm, Monday-Friday.  Thank you,  Dr. Lynann Bologna

## 2023-02-26 ENCOUNTER — Telehealth: Payer: Self-pay | Admitting: Gastroenterology

## 2023-02-26 DIAGNOSIS — Z1211 Encounter for screening for malignant neoplasm of colon: Secondary | ICD-10-CM

## 2023-02-26 NOTE — Telephone Encounter (Signed)
 Patient scheduled for procedure 1/15. Please advise.

## 2023-03-01 NOTE — Telephone Encounter (Signed)
 Instructions and referral done Patient instructed and will call back with any questions or concerns

## 2023-03-02 ENCOUNTER — Ambulatory Visit (HOSPITAL_COMMUNITY): Payer: Medicare Other

## 2023-03-04 ENCOUNTER — Ambulatory Visit (HOSPITAL_COMMUNITY)
Admission: RE | Admit: 2023-03-04 | Discharge: 2023-03-04 | Disposition: A | Discharge status: Home / Self Care | Payer: Medicare Other | Source: Ambulatory Visit | Attending: Gastroenterology | Admitting: Gastroenterology

## 2023-03-04 DIAGNOSIS — R1032 Left lower quadrant pain: Secondary | ICD-10-CM | POA: Insufficient documentation

## 2023-03-04 MED ORDER — IOHEXOL 300 MG/ML  SOLN
30.0000 mL | Freq: Once | INTRAMUSCULAR | Status: AC | PRN
  Administered 2023-03-04: 30 mL via ORAL

## 2023-03-04 MED ORDER — IOHEXOL 300 MG/ML  SOLN
100.0000 mL | Freq: Once | INTRAMUSCULAR | Status: AC | PRN
  Administered 2023-03-04: 100 mL via INTRAVENOUS

## 2023-03-07 NOTE — Progress Notes (Signed)
 Please inform the patient. CT findings noted. -Left inguinal hernia containing sigmoid colon (will discuss this when he comes for procedure visit) -10 mm lesion in the right lobe of the liver: Proceed with MRI with and without contrast as recommended by radiology. -Heterogeneous enlarged prostate: Please obtain urology consultation for abnormal CT.  If he already has urologist, he needs to follow-up with him Send report to family physician

## 2023-03-08 ENCOUNTER — Other Ambulatory Visit: Payer: Self-pay

## 2023-03-08 DIAGNOSIS — K769 Liver disease, unspecified: Secondary | ICD-10-CM

## 2023-03-10 ENCOUNTER — Encounter: Payer: Self-pay | Admitting: Gastroenterology

## 2023-03-10 ENCOUNTER — Ambulatory Visit: Payer: Medicare Other | Admitting: Gastroenterology

## 2023-03-10 ENCOUNTER — Telehealth: Payer: Self-pay

## 2023-03-10 VITALS — BP 122/62 | HR 63 | Temp 97.2°F | Resp 12 | Ht 69.0 in | Wt 149.0 lb

## 2023-03-10 DIAGNOSIS — K621 Rectal polyp: Secondary | ICD-10-CM | POA: Diagnosis not present

## 2023-03-10 DIAGNOSIS — K5730 Diverticulosis of large intestine without perforation or abscess without bleeding: Secondary | ICD-10-CM | POA: Diagnosis not present

## 2023-03-10 DIAGNOSIS — K648 Other hemorrhoids: Secondary | ICD-10-CM | POA: Diagnosis not present

## 2023-03-10 DIAGNOSIS — Z1211 Encounter for screening for malignant neoplasm of colon: Secondary | ICD-10-CM | POA: Diagnosis not present

## 2023-03-10 DIAGNOSIS — D128 Benign neoplasm of rectum: Secondary | ICD-10-CM

## 2023-03-10 DIAGNOSIS — Z980 Intestinal bypass and anastomosis status: Secondary | ICD-10-CM

## 2023-03-10 MED ORDER — SODIUM CHLORIDE 0.9 % IV SOLN: 500.0000 mL | Freq: Once | INTRAVENOUS | Status: DC

## 2023-03-10 NOTE — Telephone Encounter (Signed)
 Pt made aware of Dr. Venice Gillis recommendations off of the procedure report for the referral for [facility].  Referral was sent along with pt records, Pt was made aware that they will contact him and if he has not heard from them within two week then to contact our office. Pt verbalized understanding with all questions answered.

## 2023-03-10 NOTE — Op Note (Signed)
 [facility] Patient Name: Chad Juarez Procedure Date: 03/10/2023 1:22 PM MRN: [id number] Endoscopist: Lajuan Pila , MD, [id number] Age: 73 Referring MD:  Date of Birth: Sep 30, 1950 Gender: Male Account #: [id number] Procedure:                Colonoscopy Indications:              Screening for colorectal malignant neoplasm. H/O                            sigmoid duplication cyst s/p partial sigmoid                            resection. Medicines:                Monitored Anesthesia Care Procedure:                Pre-Anesthesia Assessment:                           - Prior to the procedure, a History and Physical                            was performed, and patient medications and                            allergies were reviewed. The patient's tolerance of                            previous anesthesia was also reviewed. The risks                            and benefits of the procedure and the sedation                            options and risks were discussed with the patient.                            All questions were answered, and informed consent                            was obtained. Prior Anticoagulants: The patient has                            taken no anticoagulant or antiplatelet agents. ASA                            Grade Assessment: II - A patient with mild systemic                            disease. After reviewing the risks and benefits,                            the patient was deemed in satisfactory condition to  undergo the procedure.                           After obtaining informed consent, the colonoscope                            was passed under direct vision. Throughout the                            procedure, the patient's blood pressure, pulse, and                            oxygen saturations were monitored continuously. The                            Olympus Scope SN: [id number] was introduced through                             the anus and advanced to the the cecum, identified                            by appendiceal orifice and ileocecal valve. The                            colonoscopy was performed without difficulty. The                            patient tolerated the procedure well. The quality                            of the bowel preparation was good. The ileocecal                            valve, appendiceal orifice, and rectum were                            photographed. Scope In: 1:43:15 PM Scope Out: 2:01:05 PM Scope Withdrawal Time: 0 hours 13 minutes 40 seconds  Total Procedure Duration: 0 hours 17 minutes 50 seconds  Findings:                 A 6 mm polyp was found in the distal rectum. The                            polyp was sessile and best visualized on the                            retroflexed examination of the rectum. The polyp                            was removed with a cold snare. Resection and                            retrieval were complete.  There was evidence of a prior end-to-side                            colo-colonic anastomosis in the mid sigmoid colon,                            20 cm from the anal verge. This was patent and was                            characterized by healthy appearing mucosa.                           Multiple medium-mouthed diverticula were found in                            the neo-sigmoid colon, descending colon and rare in                            ascending colon.                           Non-bleeding internal hemorrhoids were found during                            retroflexion and during perianal exam. The                            hemorrhoids were moderate.                           The exam was otherwise without abnormality on                            direct and retroflexion views. Complications:            No immediate complications. Estimated Blood Loss:     Estimated blood loss:  none. Impression:               - One 6 mm polyp in the distal rectum, removed with                            a cold snare. Resected and retrieved.                           - Patent end-to-side colo-colonic anastomosis,                            characterized by healthy appearing mucosa.                           - Predominantly left colonic diverticulosis.                           - Non-bleeding internal hemorrhoids.                           -  The examination was otherwise normal on direct                            and retroflexion views. Recommendation:           - Patient has a contact number available for                            emergencies. The signs and symptoms of potential                            delayed complications were discussed with the                            patient. Return to normal activities tomorrow.                            Written discharge instructions were provided to the                            patient.                           - Resume previous diet.                           - Continue present medications.                           - Await pathology results.                           - Repeat colonoscopy for surveillance based on                            pathology results.                           - Patient has been experiencing left lower quadrant                            abdominal pain intermittently. CT Abdo/pelvis                            showed right colon to be in the left inguinal                            hernia sac. The hernia does disappear when he lies                            down. I would recommend surgical consultation. Pl                            make appointment with  surgery.                           -  The findings and recommendations were discussed                            with the patient's family. Lajuan Pila, MD 03/10/2023 2:08:18 PM This report has been signed electronically.

## 2023-03-10 NOTE — Patient Instructions (Addendum)
   Handouts on polyps,diverticulosis,& hemorrhoids given to you today.   Await pathology results on polyp removed   Continue previous diet & medications  Make appointment with [facility] -this consultation will be made by Dr Hobert Lull office   YOU HAD AN ENDOSCOPIC PROCEDURE TODAY AT THE [facility]:   Refer to the procedure report that was given to you for any specific questions about what was found during the examination.  If the procedure report does not answer your questions, please call your gastroenterologist to clarify.  If you requested that your care partner not be given the details of your procedure findings, then the procedure report has been included in a sealed envelope for you to review at your convenience later.  YOU SHOULD EXPECT: Some feelings of bloating in the abdomen. Passage of more gas than usual.  Walking can help get rid of the air that was put into your GI tract during the procedure and reduce the bloating. If you had a lower endoscopy (such as a colonoscopy or flexible sigmoidoscopy) you may notice spotting of blood in your stool or on the toilet paper. If you underwent a bowel prep for your procedure, you may not have a normal bowel movement for a few days.  Please Note:  You might notice some irritation and congestion in your nose or some drainage.  This is from the oxygen used during your procedure.  There is no need for concern and it should clear up in a day or so.  SYMPTOMS TO REPORT IMMEDIATELY:  Following lower endoscopy (colonoscopy or flexible sigmoidoscopy):  Excessive amounts of blood in the stool  Significant tenderness or worsening of abdominal pains  Swelling of the abdomen that is new, acute  Fever of 100F or higher   For urgent or emergent issues, a gastroenterologist can be reached at any hour by calling [phone]. Do not use MyChart messaging for urgent concerns.    DIET:  We do recommend a small meal at first, but then  you may proceed to your regular diet.  Drink plenty of fluids but you should avoid alcoholic beverages for 24 hours.  ACTIVITY:  You should plan to take it easy for the rest of today and you should NOT DRIVE or use heavy machinery until tomorrow (because of the sedation medicines used during the test).    FOLLOW UP: Our staff will call the number listed on your records the next business day following your procedure.  We will call around 7:15- 8:00 am to check on you and address any questions or concerns that you may have regarding the information given to you following your procedure. If we do not reach you, we will leave a message.     If any biopsies were taken you will be contacted by phone or by letter within the next 1-3 weeks.  Please call us  at [phone] if you have not heard about the biopsies in 3 weeks.    SIGNATURES/CONFIDENTIALITY: You and/or your care partner have signed paperwork which will be entered into your electronic medical record.  These signatures attest to the fact that that the information above on your After Visit Summary has been reviewed and is understood.  Full responsibility of the confidentiality of this discharge information lies with you and/or your care-partner.

## 2023-03-10 NOTE — Progress Notes (Signed)
 Called to room to assist during endoscopic procedure.  Patient ID and intended procedure confirmed with present staff. Received instructions for my participation in the procedure from the performing physician.

## 2023-03-10 NOTE — Progress Notes (Signed)
 Chief Complaint: GI problems  Referring Provider:  Edda Goo, MD      ASSESSMENT AND PLAN;   #1. CRC screening  #2. LLQ pain- R/O diverticulitis.  #3. H/O duplication cyst s/p sigmoid partial colectomy 2011  Plan: -CBC, CMP -CT AP with contrast -Colon with MiraLAX   Discussed risks & benefits of colonoscopy. Risks including rare perforation req laparotomy, bleeding after bx/polypectomy req blood transfusion, rarely missing neoplasms, risks of anesthesia/sedation, rare risk of damage to internal organs. Benefits outweigh the risks. Patient agrees to proceed. All the questions were answered. Pt consents to proceed. HPI:    Chad Juarez is a 73 y.o. 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  History of Present Illness   The patient, with a history of diverticulosis and a duplication cyst of the sigmoid colon, underwent a sigmoid colectomy in 2011. They have had two colonoscopies, one before and one after the surgery, both of which showed diverticulosis and internal hemorrhoids. The patient has been advised to have another colonoscopy due to the passage of more than ten years since the last one.  Recently, the patient experienced an episode of constipation about a month ago, which required significant straining. Since then, they have noticed some tenderness in the left lower abdomen. The patient managed the constipation with Metamucil for three days and has not had any problems since. They also consume a Granny Smith apple every night and drink plenty of water.  No fever or chills.     No nausea, vomiting, heartburn, regurgitation, odynophagia or dysphagia.  No significant diarrhea or constipation (better with metamucil)).  No melena or hematochezia. No unintentional weight loss.     Previous colonoscopy-Dr. Andriette Keeling Colon 11/2009 -proab giant sig div -Mild div -anastomasis @ 18 cm  EGD: nl 08/2011  Past Medical History:  Diagnosis Date   Allergy    Atypical nevus 11/28/2008   Left Sideburn (w/s)   Basal cell carcinoma 11/28/2008   Left Nose (Cx3,Exc)   Depression    Diverticulosis 10/2004   GERD (gastroesophageal reflux disease) 07/2001   GI bleeding 09/2004   History of Helicobacter pylori infection 2004   History of kidney stones 04/1989   HLD (hyperlipidemia) 12/2002   Nodular basal cell carcinoma (BCC) 05/23/2014   Right Ear Rim (Cx3,Cautery)   Ventral hernia     Past Surgical History:  Procedure Laterality Date   COLECTOMY  02/05/2010   KIDNEY STONE SURGERY     TONSILLECTOMY      Family History  Problem Relation Age of Onset   Clotting disorder Mother        pulmonary embolism   Heart disease Mother    Hyperlipidemia Mother    Hypertension Mother    Mental illness Mother    Colon polyps Father    Hypertension Father    Heart disease Father    Hyperlipidemia Father    Hyperlipidemia Sister    Heart disease Sister    Hyperlipidemia Brother    Heart disease Maternal Grandmother    Hyperlipidemia Maternal Grandmother    Heart disease Maternal Grandfather    Hyperlipidemia Maternal Grandfather    Hypertension Maternal Grandfather    Heart disease Paternal Grandmother    Hyperlipidemia Paternal Grandmother    Diabetes Paternal Grandfather    Heart disease Paternal  Grandfather    Hyperlipidemia Paternal Grandfather    Hypertension Paternal Grandfather    Stroke Paternal Grandfather    Hodgkin's lymphoma Other        nephew   Colon cancer Neg Hx    Rectal cancer Neg Hx    Stomach cancer Neg Hx     Social History   Tobacco Use   Smoking status: Former    Current packs/day: 0.00    Average packs/day: 1.5 packs/day for 20.0 years (30.0 ttl pk-yrs)    Types: Cigarettes    Start date: 08/13/1980    Quit date: 08/13/2000    Years  since quitting: 22.5   Smokeless tobacco: Never  Vaping Use   Vaping status: Never Used  Substance Use Topics   Alcohol use: Not Currently   Drug use: No    Current Outpatient Medications  Medication Sig Dispense Refill   aspirin 81 MG tablet Take 81 mg by mouth daily. Reported on 08/01/2015     fluticasone  (FLONASE ) 50 MCG/ACT nasal spray SHAKE LIQUID AND USE 1 SPRAY IN EACH NOSTRIL TWICE DAILY AS NEEDED FOR ALLERGIES OR RHINITIS 16 g 5   metoprolol  succinate (TOPROL -XL) 25 MG 24 hr tablet Take 0.5 tablets (12.5 mg total) by mouth daily. 45 tablet 3   Multiple Vitamin (MULTIVITAMIN) tablet Take 1 tablet by mouth daily.     PREVIDENT 5000 SENSITIVE 1.1-5 % GEL Place onto teeth at bedtime.     rosuvastatin  (CRESTOR ) 10 MG tablet TAKE 1 TABLET(10 MG) BY MOUTH DAILY 90 tablet 3   Current Facility-Administered Medications  Medication Dose Route Frequency Provider Last Rate Last Admin   0.9 %  sodium chloride  infusion  500 mL Intravenous Once Lajuan Pila, MD        No Known Allergies  Review of Systems:  Constitutional: Denies fever, chills, diaphoresis, appetite change and fatigue.  HEENT: Denies photophobia, eye pain, redness, hearing loss, ear pain, congestion, sore throat, rhinorrhea, sneezing, mouth sores, neck pain, neck stiffness and tinnitus.   Respiratory: Denies SOB, DOE, cough, chest tightness,  and wheezing.   Cardiovascular: Denies chest pain, palpitations and leg swelling.  Genitourinary: Denies dysuria, urgency, frequency, hematuria, flank pain and difficulty urinating.  Musculoskeletal: Denies myalgias, back pain, joint swelling, arthralgias and gait problem.  Skin: No rash.  Neurological: Denies dizziness, seizures, syncope, weakness, light-headedness, numbness and headaches.  Hematological: Denies adenopathy. Easy bruising, personal or family bleeding history  Psychiatric/Behavioral: No anxiety or has depression     Physical Exam:    BP (!) 143/76   Pulse 72    Temp (!) 97.2 F (36.2 C)   Ht 5\' 9"  (1.753 m)   Wt 149 lb (67.6 kg)   SpO2 98%   BMI 22.00 kg/m  Wt Readings from Last 3 Encounters:  03/10/23 149 lb (67.6 kg)  02/10/23 149 lb 12.8 oz (67.9 kg)  08/31/22 147 lb 9.6 oz (67 kg)   Constitutional:  Well-developed, in no acute distress. Psychiatric: Normal mood and affect. Behavior is normal. HEENT: Pupils normal.  Conjunctivae are normal. No scleral icterus. Neck supple.  Cardiovascular: Normal rate, regular rhythm. No edema Pulmonary/chest: Effort normal and breath sounds normal. No wheezing, rales or rhonchi. Abdominal: Soft, nondistended. LLQ mild abdominal tenderness. Bowel sounds active throughout. There are no masses palpable. No hepatomegaly.  Small incisional hernias. Rectal: Deferred Neurological: Alert and oriented to person place and time. Skin: Skin is warm and dry. No rashes noted.  Data Reviewed: I have personally reviewed following labs  and imaging studies  CBC:    Latest Ref Rng & Units 02/10/2023    9:40 AM 09/10/2020    9:40 AM 05/30/2020    6:06 PM  CBC  WBC 4.0 - 10.5 K/uL 7.1  5.0  5.3   Hemoglobin 13.0 - 17.0 g/dL 16.1  09.6  04.5   Hematocrit 39.0 - 52.0 % 43.6  42.4  44.8   Platelets 150.0 - 400.0 K/uL 199.0  202.0  188     CMP:    Latest Ref Rng & Units 02/10/2023    9:40 AM 09/10/2020    9:40 AM 05/30/2020    6:06 PM  CMP  Glucose 70 - 99 mg/dL 409  811  914   BUN 6 - 23 mg/dL 21  17  28    Creatinine 0.40 - 1.50 mg/dL 7.82  9.56  2.13   Sodium 135 - 145 mEq/L 138  138  139   Potassium 3.5 - 5.1 mEq/L 4.4  4.8  3.9   Chloride 96 - 112 mEq/L 101  101  101   CO2 19 - 32 mEq/L 31  30  28    Calcium  8.4 - 10.5 mg/dL 9.7  9.6  08.6   Total Protein 6.0 - 8.3 g/dL 6.8  6.4    Total Bilirubin 0.2 - 1.2 mg/dL 0.5  0.7    Alkaline Phos 39 - 117 U/L 74  67    AST 0 - 37 U/L 26  20    ALT 0 - 53 U/L 39  25          Magnus Schuller, MD 03/10/2023, 1:38 PM  Cc: Edda Goo, MD

## 2023-03-10 NOTE — Progress Notes (Signed)
 Report to PACU, RN, vss, BBS= Clear.

## 2023-03-11 ENCOUNTER — Telehealth: Payer: Self-pay

## 2023-03-11 NOTE — Telephone Encounter (Signed)
  Follow up Call-     03/10/2023   12:33 PM  Call back number  Post procedure Call Back phone  # [phone]  Permission to leave phone message Yes     Patient questions:  Do you have a fever, pain , or abdominal swelling? No. Pain Score  0 *  Have you tolerated food without any problems? Yes.    Have you been able to return to your normal activities? Yes.    Do you have any questions about your discharge instructions: Diet   No. Medications  No. Follow up visit  No.  Do you have questions or concerns about your Care? No.  Actions: * If pain score is 4 or above: No action needed, pain <4.

## 2023-03-15 LAB — SURGICAL PATHOLOGY

## 2023-03-21 ENCOUNTER — Encounter: Payer: Self-pay | Admitting: Gastroenterology

## 2023-03-24 NOTE — Telephone Encounter (Signed)
Patient is returning your call.

## 2023-03-24 NOTE — Telephone Encounter (Signed)
Patient calling in regards to referral to [facility].  Please advise.   Thank you

## 2023-03-24 NOTE — Telephone Encounter (Signed)
Left message for pt to call back

## 2023-03-24 NOTE — Telephone Encounter (Signed)
Pt stated that he called [facility] this AM and they stated that they have not received a referral. Recent paper faxed were reviewed and noted that Fax was recently sent on 03/10/2023. Confirmation page also received. Pt made aware.  Referral was resent today. Pt made aware.  Pt verbalized understanding with all questions answered.

## 2023-03-29 NOTE — Telephone Encounter (Signed)
PT is calling about the referral to [facility] for a hernia. He was very upset because they have not received it and he feels that we aren't recognizing because he's scheduled for an MRI already. Please advise.

## 2023-03-29 NOTE — Telephone Encounter (Signed)
[facility] contacted and stated that they have received the referral and that the will be reaching out to the pt. Pt made aware.  Pt verbalized understanding with all questions answered.

## 2023-04-09 ENCOUNTER — Ambulatory Visit: Payer: Self-pay | Admitting: Surgery

## 2023-04-09 ENCOUNTER — Telehealth: Payer: Self-pay | Admitting: *Deleted

## 2023-04-09 ENCOUNTER — Telehealth: Payer: Self-pay

## 2023-04-09 NOTE — Telephone Encounter (Deleted)
Marland Kitchen  SURG

## 2023-04-09 NOTE — Telephone Encounter (Signed)
Patient has been scheduled. Med rec and consent done.

## 2023-04-09 NOTE — Telephone Encounter (Signed)
   Name: Chad Juarez  DOB: 27-Sep-1950  MRN: [id number]  Primary Cardiologist: Peter Swaziland, MD   Preoperative team, please contact this patient and set up a phone call appointment for further preoperative risk assessment. Please obtain consent and complete medication review. Thank you for your help.  I confirm that guidance regarding antiplatelet and oral anticoagulation therapy has been completed and, if necessary, noted below.  Per protocol patient could hold ASA 81 mg 7 days prior to procedure and should restart postprocedure when surgically safe and advised by performing provider.  I also confirmed the patient resides in the state of [place]. As per [facility] Medical Board telemedicine laws, the patient must reside in the state in which the provider is licensed.   [name], Leodis Rains, NP 04/09/2023, 11:35 AM [facility]

## 2023-04-09 NOTE — H&P (Signed)
Subjective   Chief Complaint: New Consultation ( Left inguinal hernia)     History of Present Illness: Chad Juarez is a 73 y.o. male who is seen today as an office consultation at the request of Dr. Chales Abrahams for evaluation of New Consultation ( Left inguinal hernia) .   This is a 73 year old male who presents with a 5-year history of a slowly enlarging left inguinal hernia.  The patient is fairly active and works out regularly.  Patient developed a fairly large bulge but this remains reducible.  Recently he had a CT scan for some vague abdominal pain.  This showed a large left inguinal hernia containing fat and a nonobstructed portion of the sigmoid colon.  No sign of diverticulitis.  Patient states that he also occasionally notices a bulge on the right side that is reducible.  He has no pain associated with the right inguinal hernia.   Review of Systems: A complete review of systems was obtained from the patient.  I have reviewed this information and discussed as appropriate with the patient.  See HPI as well for other ROS.  Review of Systems  Constitutional: Negative.   HENT:  Positive for congestion.   Eyes: Negative.   Respiratory: Negative.    Cardiovascular:  Positive for palpitations.  Gastrointestinal:  Positive for abdominal pain, heartburn and nausea.  Genitourinary:  Positive for dysuria.  Musculoskeletal: Negative.   Skin: Negative.   Neurological: Negative.   Endo/Heme/Allergies: Negative.   Psychiatric/Behavioral: Negative.        Medical History: Past Medical History:  Diagnosis Date   Arrhythmia    GERD (gastroesophageal reflux disease)    History of cancer    Hyperlipidemia    Sleep apnea     Patient Active Problem List  Diagnosis   Frequent PVCs   Irregular heart beat   Hyperlipidemia with target LDL less than 100    Past Surgical History:  Procedure Laterality Date   Partial sigmoidectomy     TONSILLECTOMY & ADENOIDECTOMY     Transurethral kidney  stone removal       No Known Allergies  Current Outpatient Medications on File Prior to Visit  Medication Sig Dispense Refill   aspirin 81 MG EC tablet Take 81 mg by mouth     fluticasone propionate (FLONASE) 50 mcg/actuation nasal spray SHAKE LIQUID AND USE 1 TO 2 SPRAYS IN EACH NOSTRIL EVERY DAY AS NEEDED     metoprolol SUCCinate (TOPROL-XL) 25 MG XL tablet Take 12.5 mg by mouth once daily     PREVIDENT 5000 SENSITIVE 1.1-5 % USE EVERY EVENING AS TOOTHPASTE . DO NOT RINSE FOR 30 MINS AFTER     rosuvastatin (CRESTOR) 10 MG tablet Take 10 mg by mouth once daily     vitamin B complex (B COMPLEX ORAL) Take by mouth     No current facility-administered medications on file prior to visit.    Family History  Problem Relation Age of Onset   High blood pressure (Hypertension) Mother    Hyperlipidemia (Elevated cholesterol) Mother    Deep vein thrombosis (DVT or abnormal blood clot formation) Mother    Coronary Artery Disease (Blocked arteries around heart) Mother    Dementia Mother    Skin cancer Father    High blood pressure (Hypertension) Father    Hyperlipidemia (Elevated cholesterol) Father    High blood pressure (Hypertension) Sister    Dementia Brother      Social History   Tobacco Use  Smoking Status Former  Types: Cigarettes  Smokeless Tobacco Never     Social History   Socioeconomic History   Marital status: Single  Tobacco Use   Smoking status: Former    Types: Cigarettes   Smokeless tobacco: Never  Vaping Use   Vaping status: Never Used  Substance and Sexual Activity   Alcohol use: Not Currently   Drug use: Never   Social Drivers of Health   Financial Resource Strain: Low Risk  (08/22/2018)   Received from [facility]   Overall Financial Resource Strain (CARDIA)    Difficulty of Paying Living Expenses: Not hard at all  Food Insecurity: No Food Insecurity (08/22/2018)   Received from [facility]   Hunger Vital Sign    Worried About Running Out of Food  in the Last Year: Never true    Ran Out of Food in the Last Year: Never true  Transportation Needs: No Transportation Needs (08/22/2018)   Received from [facility] - Transportation    Lack of Transportation (Medical): No    Lack of Transportation (Non-Medical): No  Physical Activity: Sufficiently Active (08/22/2018)   Received from [facility]   Exercise Vital Sign    Days of Exercise per Week: 5 days    Minutes of Exercise per Session: 60 min  Stress: No Stress Concern Present (08/22/2018)   Received from [facility] - Occupational Stress Questionnaire    Feeling of Stress : Only a little  Social Connections: Unknown (08/22/2018)   Received from [facility]   Social Connection and Isolation Panel [NHANES]    Frequency of Communication with Friends and Family: Three times a week    Frequency of Social Gatherings with Friends and Family: Twice a week    Attends Religious Services: Patient declined    Active Member of Clubs or Organizations: Patient declined    Attends Banker Meetings: Patient declined    Marital Status: Never married  Housing Stability: Unknown (04/09/2023)   Housing Stability Vital Sign    Homeless in the Last Year: No    Objective:    Vitals:   04/09/23 1011  BP: (!) 145/72  Pulse: 73  Temp: 36.2 C (97.2 F)  SpO2: 99%  Weight: 68.8 kg (151 lb 9.6 oz)  Height: 176.5 cm (5' 9.5")  PainSc:   2    Body mass index is 22.07 kg/m.  Physical Exam   Constitutional:  WDWN in NAD, conversant, no obvious deformities; lying in bed comfortably Eyes:  Pupils equal, round; sclera anicteric; moist conjunctiva; no lid lag HENT:  Oral mucosa moist; good dentition  Neck:  No masses palpated, trachea midline; no thyromegaly Lungs:  CTA bilaterally; normal respiratory effort CV:  Regular rate and rhythm; no murmurs; extremities well-perfused with no edema Abd:  +bowel sounds, soft, non-tender, no  palpable organomegaly; no palpable hernias, small epigastric rectus diastases GU: Bilateral descended testes, no testicular masses, bilateral inguinal hernias that are reducible Musc: Normal gait; no apparent clubbing or cyanosis in extremities Lymphatic:  No palpable cervical or axillary lymphadenopathy Skin:  Warm, dry; no sign of jaundice Psychiatric - alert and oriented x 4; calm mood and affect   Labs, Imaging and Diagnostic Testing: CLINICAL DATA:  Left lower quadrant abdominal pain.   EXAM: CT ABDOMEN AND PELVIS WITH CONTRAST   TECHNIQUE: Multidetector CT imaging of the abdomen and pelvis was performed using the standard protocol following bolus administration of intravenous contrast.   RADIATION DOSE  REDUCTION: This exam was performed according to the departmental dose-optimization program which includes automated exposure control, adjustment of the mA and/or kV according to patient size and/or use of iterative reconstruction technique.   CONTRAST:  OMNIPAQUE IOHEXOL 300 MG/ML  SOLN   COMPARISON:  Ultrasound May 30, 2008   FINDINGS: Lower chest: No acute abnormality.   Hepatobiliary: Ill-defined hypodense 10 mm lesion in the right lobe of the liver on image 16/2. Gallbladder is unremarkable. No biliary ductal dilation.   Pancreas: No pancreatic ductal dilation or evidence of acute inflammation.   Spleen: No splenomegaly.   Adrenals/Urinary Tract: Bilateral adrenal glands appear normal. No hydronephrosis. Kidneys demonstrate symmetric enhancement. Urinary bladder is unremarkable for degree of distension.   Stomach/Bowel: Radiopaque enteric contrast material reaches the splenic flexure. Stomach is unremarkable for degree of distension. No pathologic dilation of small or large bowel. Normal appendix. Moderate volume of formed stool in the colon. Prior partial sigmoidectomy with anastomotic sutures in the midline pelvis. Descending and sigmoid colonic  diverticulosis without findings of acute diverticulitis   Vascular/Lymphatic: Aortic atherosclerosis. Normal caliber abdominal aorta. Smooth IVC contours. Portal, splenic and superior mesenteric veins are patent. No pathologically enlarged abdominal or pelvic lymph nodes.   Reproductive: Heterogeneous enhancement of an enlarged prostate gland.   Other: Left inguinal hernia contains fat and nonobstructed portion of sigmoid colon.   Musculoskeletal: Multilevel degenerative changes spine. Degenerative change of the bilateral hips and SI joints.   IMPRESSION: 1. Left inguinal hernia contains fat and nonobstructed portion of sigmoid colon. 2. Descending and sigmoid colonic diverticulosis without findings of acute diverticulitis. 3. Ill-defined hypodense 10 mm lesion in the right lobe of the liver, nonspecific but statistically likely to reflect a benign lesion such as a hemangioma. Consider further evaluation with nonemergent liver protocol MRI with and without contrast. 4. Heterogeneous enhancement of an enlarged prostate gland, consider urology consultation. 5.  Aortic Atherosclerosis (ICD10-I70.0).     Electronically Signed   By: Maudry Mayhew M.D.   On: 03/07/2023 12:12  Assessment and Plan:  Diagnoses and all orders for this visit:  Non-recurrent bilateral inguinal hernia without obstruction or gangrene    Open bilateral inguinal hernia repairs with mesh.The surgical procedure has been discussed with the patient.  Potential risks, benefits, alternative treatments, and expected outcomes have been explained.  All of the patient's questions at this time have been answered.  The likelihood of reaching the patient's treatment goal is good.  The patient understands the proposed surgical procedure and wishes to proceed.    Lissa Morales, MD  04/09/2023 12:14 PM

## 2023-04-09 NOTE — Telephone Encounter (Signed)
   Pre-operative Risk Assessment    Patient Name: Chad Juarez  DOB: 13-Sep-1950 MRN: [id number]   Date of last office visit: 08/31/22 DR. Swaziland Date of next office visit: NONE   Request for Surgical Clearance    Procedure:   HERNIA SURGERY NOTED PER CLEARANCE FORM  Date of Surgery:  Clearance TBD                                Surgeon:  DR. MATTHEW TSUEI Surgeon's Group or Practice Name:  [facility] Phone number:  [phone] Fax number:  [phone] ATTN: Michel Bickers, LPN   Type of Clearance Requested:   - Medical  - Pharmacy:  Hold Aspirin     Type of Anesthesia:  General    Additional requests/questions:    Elpidio Anis   04/09/2023, 11:32 AM

## 2023-04-09 NOTE — Telephone Encounter (Signed)
Patient has been scheduled. Med rec and consent done.     Patient Consent for Virtual Visit         Chad Juarez has provided verbal consent on 04/09/2023 for a virtual visit (video or telephone).   CONSENT FOR VIRTUAL VISIT FOR:  Chad Juarez  By participating in this virtual visit I agree to the following:  I hereby voluntarily request, consent and authorize [facility] and its employed or contracted physicians, physician assistants, nurse practitioners or other licensed health care professionals (the Practitioner), to provide me with telemedicine health care services (the "Services") as deemed necessary by the treating Practitioner. I acknowledge and consent to receive the Services by the Practitioner via telemedicine. I understand that the telemedicine visit will involve communicating with the Practitioner through live audiovisual communication technology and the disclosure of certain medical information by electronic transmission. I acknowledge that I have been given the opportunity to request an in-person assessment or other available alternative prior to the telemedicine visit and am voluntarily participating in the telemedicine visit.  I understand that I have the right to withhold or withdraw my consent to the use of telemedicine in the course of my care at any time, without affecting my right to future care or treatment, and that the Practitioner or I may terminate the telemedicine visit at any time. I understand that I have the right to inspect all information obtained and/or recorded in the course of the telemedicine visit and may receive copies of available information for a reasonable fee.  I understand that some of the potential risks of receiving the Services via telemedicine include:  Delay or interruption in medical evaluation due to technological equipment failure or disruption; Information transmitted may not be sufficient (e.g. poor resolution of images) to allow for  appropriate medical decision making by the Practitioner; and/or  In rare instances, security protocols could fail, causing a breach of personal health information.  Furthermore, I acknowledge that it is my responsibility to provide information about my medical history, conditions and care that is complete and accurate to the best of my ability. I acknowledge that Practitioner's advice, recommendations, and/or decision may be based on factors not within their control, such as incomplete or inaccurate data provided by me or distortions of diagnostic images or specimens that may result from electronic transmissions. I understand that the practice of medicine is not an exact science and that Practitioner makes no warranties or guarantees regarding treatment outcomes. I acknowledge that a copy of this consent can be made available to me via my patient portal [facility] MyChart), or I can request a printed copy by calling the office of [facility].    I understand that my insurance will be billed for this visit.   I have read or had this consent read to me. I understand the contents of this consent, which adequately explains the benefits and risks of the Services being provided via telemedicine.  I have been provided ample opportunity to ask questions regarding this consent and the Services and have had my questions answered to my satisfaction. I give my informed consent for the services to be provided through the use of telemedicine in my medical care   22

## 2023-04-11 ENCOUNTER — Ambulatory Visit
Admission: RE | Admit: 2023-04-11 | Discharge: 2023-04-11 | Disposition: A | Discharge status: Home / Self Care | Payer: Medicare Other | Source: Ambulatory Visit | Attending: Gastroenterology | Admitting: Gastroenterology

## 2023-04-11 DIAGNOSIS — K769 Liver disease, unspecified: Secondary | ICD-10-CM

## 2023-04-11 MED ORDER — GADOPICLENOL 0.5 MMOL/ML IV SOLN
7.0000 mL | Freq: Once | INTRAVENOUS | Status: AC | PRN
  Administered 2023-04-11: 7 mL via INTRAVENOUS

## 2023-04-23 ENCOUNTER — Ambulatory Visit: Payer: Medicare Other | Attending: Internal Medicine | Admitting: Emergency Medicine

## 2023-04-23 DIAGNOSIS — Z01810 Encounter for preprocedural cardiovascular examination: Secondary | ICD-10-CM | POA: Diagnosis present

## 2023-04-23 NOTE — Progress Notes (Signed)
 Virtual Visit via Telephone Note   Because of Chad Juarez co-morbid illnesses, he is at least at moderate risk for complications without adequate follow up.  This format is felt to be most appropriate for this patient at this time.  Due to technical limitations with video connection Web designer), today's appointment will be conducted as an audio only telehealth visit, and [name] verbally agreed to proceed in this manner.   All issues noted in this document were discussed and addressed.  No physical exam could be performed with this format.  Evaluation Performed:  Preoperative cardiovascular risk assessment _____________   Date:  04/23/2023   Patient ID:  Chad Juarez, DOB 1950-11-19, MRN [id number] Patient Location:  Home Provider location:   Office  Primary Care Provider:  Ralene Ok, MD Primary Cardiologist:  Peter [place], MD  Chief Complaint / Patient Profile   73 y.o. y/o male with a h/o palpitations, PVCs, HLD, depression, prior heavy smoker who is pending hernia surgery with Dr. Corliss Skains with [facility] and presents today for telephonic preoperative cardiovascular risk assessment.  History of Present Illness    Chad Juarez is a 73 y.o. male who presents via audio/video conferencing for a telehealth visit today.  Pt was last seen in cardiology clinic on 08/31/2022 by [place].  At that time Chad Juarez was doing well.  The patient is now pending procedure as outlined above. Since his last visit, he denies chest pain, shortness of breath, lower extremity edema, fatigue, palpitations, presyncope, syncope, orthopnea, and PND.  Past Medical History    Past Medical History:  Diagnosis Date   Allergy    Atypical nevus 11/28/2008   Left Sideburn (w/s)   Basal cell carcinoma 11/28/2008   Left Nose (Cx3,Exc)   Depression    Diverticulosis 10/2004   GERD (gastroesophageal reflux disease) 07/2001   GI bleeding 09/2004   History of Helicobacter pylori infection 2004    History of kidney stones 04/1989   HLD (hyperlipidemia) 12/2002   Nodular basal cell carcinoma (BCC) 05/23/2014   Right Ear Rim (Cx3,Cautery)   Ventral hernia    Past Surgical History:  Procedure Laterality Date   COLECTOMY  02/05/2010   KIDNEY STONE SURGERY     TONSILLECTOMY      Allergies  No Known Allergies  Home Medications    Prior to Admission medications   Medication Sig Start Date End Date Taking? Authorizing Provider  aspirin 81 MG tablet Take 81 mg by mouth daily. Reported on 08/01/2015    [provider]  fluticasone (FLONASE) 50 MCG/ACT nasal spray SHAKE LIQUID AND USE 1 SPRAY IN EACH NOSTRIL TWICE DAILY AS NEEDED FOR ALLERGIES OR RHINITIS 05/27/21   Janeece Agee, NP  metoprolol succinate (TOPROL-XL) 25 MG 24 hr tablet Take 0.5 tablets (12.5 mg total) by mouth daily. 08/31/22   [place], Peter M, MD  Multiple Vitamin (MULTIVITAMIN) tablet Take 1 tablet by mouth daily.    [provider]  PREVIDENT 5000 SENSITIVE 1.1-5 % GEL Place onto teeth at bedtime. 05/17/21   [provider]  rosuvastatin (CRESTOR) 10 MG tablet TAKE 1 TABLET(10 MG) BY MOUTH DAILY 08/31/22   [place], Peter M, MD    Physical Exam    Vital Signs:  [name] does not have vital signs available for review today.  Given telephonic nature of communication, physical exam is limited. AAOx3. NAD. Normal affect.  Speech and respirations are unlabored.  Accessory Clinical Findings    None  Assessment & Plan    1.  Preoperative Cardiovascular Risk Assessment: According to the Revised Cardiac Risk Index (RCRI), his Perioperative Risk of Major Cardiac Event is (%): 0.4. His Functional Capacity in METs is: 7.25 according to the Duke Activity Status Index (DASI). Therefore, based on ACC/AHA guidelines, patient would be at acceptable risk for the planned procedure without further cardiovascular testing.  The patient was advised that if he develops new symptoms prior to surgery to  contact our office to arrange for a follow-up visit, and he verbalized understanding.  Per protocol patient could hold ASA 81 mg 7 days prior to procedure and should restart postprocedure when surgically safe and advised by performing provider.   A copy of this note will be routed to requesting surgeon.  Time:   Today, I have spent 8 minutes with the patient with telehealth technology discussing medical history, symptoms, and management plan.     Denyce Robert, NP  04/23/2023, 7:46 AM

## 2023-08-30 NOTE — Progress Notes (Signed)
 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 He has a hx of palpitations, PVCs, HLD, depression, prior heavy smoker stopped in 2002 family history of CAD, Cor CTA 02/2020 calcium  score 61.9 & minimal nonobstructive CAD in the RCA and LAD. PVCs managed with low dose metoprolol .   On follow up today he feels well. No chest pain, dyspnea, or palpitations. He exercises regularly. Has developed some BPH. Started on Flomax which causes dizziness but he states he can't pee without it. Had colonoscopy this year that was OK.      Past Medical History:  Diagnosis Date   Allergy    Atypical nevus 11/28/2008   Left Sideburn (w/s)   Basal cell carcinoma 11/28/2008   Left Nose (Cx3,Exc)   Depression    Diverticulosis 10/2004   GERD (gastroesophageal reflux disease) 07/2001   GI bleeding 09/2004   History of Helicobacter pylori infection 2004   History of kidney stones 04/1989   HLD (hyperlipidemia) 12/2002   Nodular basal cell carcinoma (BCC) 05/23/2014   Right Ear Rim (Cx3,Cautery)   Ventral hernia     Past Surgical History:  Procedure Laterality Date   COLECTOMY  02/05/2010   KIDNEY STONE SURGERY     TONSILLECTOMY      Current Medications: Current Meds  Medication Sig   aspirin 81 MG tablet Take 81 mg by mouth daily. Reported on 08/01/2015   fluticasone  (FLONASE ) 50 MCG/ACT nasal spray SHAKE LIQUID AND USE 1 SPRAY IN EACH NOSTRIL TWICE DAILY AS NEEDED FOR ALLERGIES OR RHINITIS   Multiple Vitamin (MULTIVITAMIN) tablet Take 1 tablet by mouth daily.   PREVIDENT 5000 SENSITIVE 1.1-5 % GEL Place onto teeth at bedtime.   tamsulosin (FLOMAX) 0.4 MG CAPS capsule Take 0.4 mg by mouth  daily.   valACYclovir (VALTREX) 1000 MG tablet Take 1,000 mg by mouth every 8 (eight) hours.   [DISCONTINUED] metoprolol  succinate (TOPROL -XL) 25 MG 24 hr tablet Take 0.5 tablets (12.5 mg total) by mouth daily.   [DISCONTINUED] rosuvastatin  (CRESTOR ) 10 MG tablet TAKE 1 TABLET(10 MG) BY MOUTH DAILY     Allergies:   Patient has no known allergies.   Social History   Socioeconomic History   Marital status: Single    Spouse name: Not on file   Number of children: 0   Years of education: Not on file   Highest education level: Not on file  Occupational History   Occupation: retired Printmaker: RETIRED  Tobacco Use   Smoking status: Former    Current packs/day: 0.00    Average packs/day: 1.5 packs/day for 20.0 years (30.0 ttl pk-yrs)    Types: Cigarettes    Start date: 08/13/1980    Quit date: 08/13/2000    Years since quitting: 23.0   Smokeless tobacco: Never  Vaping Use   Vaping status: Never Used  Substance and Sexual Activity   Alcohol use: Not Currently   Drug use: No   Sexual activity: Not Currently  Other Topics Concern   Not on file  Social History Narrative   Single. Education:    Exercise walking 2-4  miles/week   Social Drivers of Health   Financial Resource Strain: Low Risk  (08/22/2018)   Overall Financial Resource Strain (CARDIA)    Difficulty of Paying Living Expenses: Not hard at all  Food Insecurity: No Food Insecurity (08/22/2018)   Hunger Vital Sign    Worried About Running Out of Food in the Last Year: Never true    Ran Out of Food in the Last Year: Never true  Transportation Needs: No Transportation Needs (08/22/2018)   PRAPARE - Administrator, Civil Service (Medical): No    Lack of Transportation (Non-Medical): No  Physical Activity: Sufficiently Active (08/22/2018)   Exercise Vital Sign    Days of Exercise per Week: 5 days    Minutes of Exercise per Session: 60 min  Stress: No Stress Concern Present (08/22/2018)   [employer] Occupational [employer] - Occupational Stress Questionnaire    Feeling of Stress : Only a little  Social Connections: Unknown (08/22/2018)   Social Connection and Isolation Panel    Frequency of Communication with Friends and Family: Three times a week    Frequency of Social Gatherings with Friends and Family: Twice a week    Attends Religious Services: Patient declined    Database administrator or Organizations: Patient declined    Attends Engineer, structural: Patient declined    Marital Status: Never married     Family History: The patient's family history includes Clotting disorder in his mother; Colon polyps in his father; Diabetes in his paternal grandfather; Heart disease in his father, maternal grandfather, maternal grandmother, mother, paternal grandfather, paternal grandmother, and sister; Hodgkin's lymphoma in an other family member; Hyperlipidemia in his brother, father, maternal grandfather, maternal grandmother, mother, paternal grandfather, paternal grandmother, and sister; Hypertension in his father, maternal grandfather, mother, and paternal grandfather; Mental illness in his mother; Stroke in his paternal grandfather. There is no history of Colon cancer, Rectal cancer, or Stomach cancer.  ROS:   Please see the history of present illness.    Change primary care physicians.  Now sees Dr. Gaither Gell.  All other systems reviewed and are negative.  EKGs/Labs/Other Studies Reviewed:    The following studies were reviewed today:  2 D Doppler ECHOCARDIOGRAM 03/20/2020 IMPRESSIONS   1. Left ventricular ejection fraction, by estimation, is 55 to 60%. The  left ventricle has normal function. The left ventricle has no regional  wall motion abnormalities. Left ventricular diastolic parameters were  normal.   2. Right ventricular systolic function is normal. The right ventricular  size is normal.   3. The mitral valve is normal in structure. Trivial mitral valve   regurgitation.   4. The aortic valve is tricuspid. Aortic valve regurgitation is not  visualized.   5. The inferior vena cava is normal in size with greater than 50%  respiratory variability, suggesting right atrial pressure of 3 mmHg.   Comparison(s): No prior Echocardiogram.     Coronary CTA March 18, 2020: IMPRESSION: 1. Coronary calcium  score of 61.9. This was 40 percentile for age and sex matched control.   2. Normal coronary origin with right dominance.   3. Minimal nonobstructive CAD as outlined above; CAD RADS 1.   Redell Shallow   EKG Interpretation Date/Time:  Friday September 03 2023 10:02:17 EDT Ventricular Rate:  79 PR Interval:  140 QRS Duration:  86 QT Interval:  348 QTC Calculation: 399 R Axis:   69  Text Interpretation: Normal sinus rhythm Normal ECG When compared  with ECG of 31-Aug-2022 15:09, No significant change was found Confirmed by [place], Desiree Daise [phone]) on 09/03/2023 10:06:16 AM    Recent Labs: 02/10/2023: ALT 39; BUN 21; Creatinine, Ser 0.79; Hemoglobin 14.5; Platelets 199.0; Potassium 4.4; Sodium 138  Recent Lipid Panel    Component Value Date/Time   CHOL 146 05/07/2021 0721   TRIG 81 05/07/2021 0721   HDL 59 05/07/2021 0721   CHOLHDL 2.5 05/07/2021 0721   CHOLHDL 2.0 08/01/2015 0846   VLDL 13 08/01/2015 0846   LDLCALC 71 05/07/2021 0721    EKG Interpretation Date/Time:  Friday September 03 2023 10:02:17 EDT Ventricular Rate:  79 PR Interval:  140 QRS Duration:  86 QT Interval:  348 QTC Calculation: 399 R Axis:   69  Text Interpretation: Normal sinus rhythm Normal ECG When compared with ECG of 31-Aug-2022 15:09, No significant change was found Confirmed by [place], Geniece Akers [phone]) on 09/03/2023 10:06:16 AM    Physical Exam:    VS:  BP 128/66   Ht 5' 9.5 (1.765 m)   Wt 147 lb 3.2 oz (66.8 kg)   BMI 21.43 kg/m     Wt Readings from Last 3 Encounters:  09/03/23 147 lb 3.2 oz (66.8 kg)  03/10/23 149 lb (67.6 kg)  02/10/23 149 lb 12.8  oz (67.9 kg)     GEN: Healthy appearing. No acute distress HEENT: Normal NECK: No JVD. LYMPHATICS: No lymphadenopathy CARDIAC: No murmur. RRR no gallop, or edema. VASCULAR:  Normal Pulses. No bruits. RESPIRATORY:  Clear to auscultation without rales, wheezing or rhonchi  ABDOMEN: Soft, non-tender, non-distended, No pulsatile mass, MUSCULOSKELETAL: No deformity  SKIN: Warm and dry NEUROLOGIC:  Alert and oriented x 3 PSYCHIATRIC:  Normal affect   ASSESSMENT:    1. Coronary artery disease involving native coronary artery of native heart without angina pectoris   2. Frequent PVCs   3. Pure hypercholesterolemia      PLAN:    In order of problems listed above:  Asymptomatic.  Continue primary prevention with statin and ASA Continue rosuvastatin .  Most recent LDL cholesterol was 71. Control with very low-dose beta-blocker therapy.  States this has really helped so will continue   Medication Adjustments/Labs and Tests Ordered: Current medicines are reviewed at length with the patient today.  Concerns regarding medicines are outlined above.  Orders Placed This Encounter  Procedures   EKG 12-Lead   Meds ordered this encounter  Medications   metoprolol  succinate (TOPROL -XL) 25 MG 24 hr tablet    Sig: Take 0.5 tablets (12.5 mg total) by mouth daily.    Dispense:  45 tablet    Refill:  3    Pt must keep upcoming appt in July 2024 with new Cardiologist Dr. [place] before anymore refills. Thank you Final Attempt   rosuvastatin  (CRESTOR ) 10 MG tablet    Sig: TAKE 1 TABLET(10 MG) BY MOUTH DAILY    Dispense:  90 tablet    Refill:  3    Pt must keep upcoming appt in July 2024 with new Cardiologist Dr. [place] before anymore refills. Thank you Final Attempt    There are no Patient Instructions on file for this visit.   Signed, Meshach Perry [place], MD  09/03/2023 10:17 AM    [facility]

## 2023-09-03 ENCOUNTER — Encounter: Payer: Self-pay | Admitting: Cardiology

## 2023-09-03 ENCOUNTER — Ambulatory Visit: Attending: Cardiology | Admitting: Cardiology

## 2023-09-03 VITALS — BP 128/66 | Ht 69.5 in | Wt 147.2 lb

## 2023-09-03 DIAGNOSIS — I493 Ventricular premature depolarization: Secondary | ICD-10-CM | POA: Diagnosis present

## 2023-09-03 DIAGNOSIS — I2510 Atherosclerotic heart disease of native coronary artery without angina pectoris: Secondary | ICD-10-CM | POA: Insufficient documentation

## 2023-09-03 DIAGNOSIS — E7800 Pure hypercholesterolemia, unspecified: Secondary | ICD-10-CM | POA: Diagnosis present

## 2023-09-03 MED ORDER — ROSUVASTATIN CALCIUM 10 MG PO TABS: ORAL_TABLET | ORAL | 3 refills | Status: AC

## 2023-09-03 MED ORDER — METOPROLOL SUCCINATE ER 25 MG PO TB24: 12.5000 mg | ORAL_TABLET | Freq: Every day | ORAL | 3 refills | Status: AC

## 2023-09-03 NOTE — Patient Instructions (Signed)
